# Patient Record
Sex: Female | Born: 1975 | Race: White | Hispanic: No | Marital: Married | Smoking: Never smoker
Health system: Southern US, Community
[De-identification: ages and names within clinical notes are randomized; demographics above are authoritative.]

## PROBLEM LIST (undated history)

## (undated) DIAGNOSIS — D649 Anemia, unspecified: Secondary | ICD-10-CM

## (undated) DIAGNOSIS — F32A Depression, unspecified: Secondary | ICD-10-CM

## (undated) DIAGNOSIS — F329 Major depressive disorder, single episode, unspecified: Secondary | ICD-10-CM

## (undated) DIAGNOSIS — D219 Benign neoplasm of connective and other soft tissue, unspecified: Secondary | ICD-10-CM

## (undated) DIAGNOSIS — Z5189 Encounter for other specified aftercare: Secondary | ICD-10-CM

## (undated) DIAGNOSIS — E785 Hyperlipidemia, unspecified: Secondary | ICD-10-CM

## (undated) DIAGNOSIS — G44209 Tension-type headache, unspecified, not intractable: Secondary | ICD-10-CM

## (undated) HISTORY — DX: Anemia, unspecified: D64.9

## (undated) HISTORY — DX: Major depressive disorder, single episode, unspecified: F32.9

## (undated) HISTORY — DX: Hyperlipidemia, unspecified: E78.5

## (undated) HISTORY — PX: OTHER SURGICAL HISTORY: SHX169

## (undated) HISTORY — DX: Depression, unspecified: F32.A

## (undated) HISTORY — PX: TOTAL ABDOMINAL HYSTERECTOMY: SHX209

## (undated) HISTORY — PX: WISDOM TOOTH EXTRACTION: SHX21

## (undated) HISTORY — DX: Tension-type headache, unspecified, not intractable: G44.209

---

## 2005-04-01 ENCOUNTER — Emergency Department: Payer: Self-pay | Admitting: Emergency Medicine

## 2005-04-01 ENCOUNTER — Other Ambulatory Visit: Payer: Self-pay

## 2006-05-09 ENCOUNTER — Ambulatory Visit: Payer: Self-pay | Admitting: Cardiology

## 2007-07-28 ENCOUNTER — Encounter (INDEPENDENT_AMBULATORY_CARE_PROVIDER_SITE_OTHER): Payer: Self-pay | Admitting: Internal Medicine

## 2007-07-31 ENCOUNTER — Ambulatory Visit: Payer: Self-pay | Admitting: Family Medicine

## 2007-07-31 ENCOUNTER — Encounter (INDEPENDENT_AMBULATORY_CARE_PROVIDER_SITE_OTHER): Payer: Self-pay | Admitting: Internal Medicine

## 2007-07-31 DIAGNOSIS — R011 Cardiac murmur, unspecified: Secondary | ICD-10-CM

## 2007-08-01 ENCOUNTER — Ambulatory Visit: Payer: Self-pay | Admitting: Internal Medicine

## 2007-08-02 LAB — CONVERTED CEMR LAB
AST: 20 units/L (ref 0–37)
Basophils Relative: 0 % (ref 0.0–1.0)
Chloride: 103 meq/L (ref 96–112)
Cholesterol: 231 mg/dL (ref 0–200)
Direct LDL: 146.6 mg/dL
Eosinophils Absolute: 0.3 10*3/uL (ref 0.0–0.6)
Eosinophils Relative: 4.3 % (ref 0.0–5.0)
GFR calc Af Amer: 93 mL/min
GFR calc non Af Amer: 77 mL/min
HCT: 39.6 % (ref 36.0–46.0)
HDL: 38.3 mg/dL — ABNORMAL LOW (ref >39.0)
Lymphocytes Relative: 47.2 % — ABNORMAL HIGH (ref 12.0–46.0)
MCV: 89.2 fL (ref 78.0–100.0)
Monocytes Relative: 7.6 % (ref 3.0–11.0)
Neutro Abs: 2.6 10*3/uL (ref 1.4–7.7)
Neutrophils Relative %: 40.9 % — ABNORMAL LOW (ref 43.0–77.0)
Platelets: 256 10*3/uL (ref 150–400)
Potassium: 4.3 meq/L (ref 3.5–5.1)
TSH: 1.08 microintl units/mL (ref 0.35–5.50)
Total CHOL/HDL Ratio: 6
Total Protein: 7 g/dL (ref 6.0–8.3)
Triglycerides: 202 mg/dL (ref 0–149)
WBC: 6.4 10*3/uL (ref 4.5–10.5)

## 2007-08-08 ENCOUNTER — Ambulatory Visit: Payer: Self-pay | Admitting: Family Medicine

## 2007-08-08 DIAGNOSIS — E782 Mixed hyperlipidemia: Secondary | ICD-10-CM

## 2007-08-29 ENCOUNTER — Ambulatory Visit: Payer: Self-pay | Admitting: Family Medicine

## 2007-09-03 ENCOUNTER — Ambulatory Visit: Payer: Self-pay | Admitting: Family Medicine

## 2007-09-03 DIAGNOSIS — M4120 Other idiopathic scoliosis, site unspecified: Secondary | ICD-10-CM | POA: Insufficient documentation

## 2007-09-03 DIAGNOSIS — IMO0002 Reserved for concepts with insufficient information to code with codable children: Secondary | ICD-10-CM

## 2007-09-05 ENCOUNTER — Other Ambulatory Visit: Payer: Self-pay

## 2007-09-06 ENCOUNTER — Observation Stay: Payer: Self-pay | Admitting: Internal Medicine

## 2007-09-07 ENCOUNTER — Encounter (INDEPENDENT_AMBULATORY_CARE_PROVIDER_SITE_OTHER): Payer: Self-pay | Admitting: Internal Medicine

## 2007-09-07 ENCOUNTER — Encounter: Payer: Self-pay | Admitting: Family Medicine

## 2007-09-12 ENCOUNTER — Ambulatory Visit: Payer: Self-pay | Admitting: Family Medicine

## 2007-09-12 ENCOUNTER — Encounter (INDEPENDENT_AMBULATORY_CARE_PROVIDER_SITE_OTHER): Payer: Self-pay | Admitting: Internal Medicine

## 2007-09-12 DIAGNOSIS — M6282 Rhabdomyolysis: Secondary | ICD-10-CM

## 2007-09-12 DIAGNOSIS — R945 Abnormal results of liver function studies: Secondary | ICD-10-CM | POA: Insufficient documentation

## 2007-09-12 DIAGNOSIS — F411 Generalized anxiety disorder: Secondary | ICD-10-CM | POA: Insufficient documentation

## 2007-09-13 ENCOUNTER — Encounter: Payer: Self-pay | Admitting: Family Medicine

## 2007-09-13 LAB — CONVERTED CEMR LAB
Bilirubin, Direct: 0.1 mg/dL (ref 0.0–0.3)
Total CK: 186 units/L — ABNORMAL HIGH (ref 7–177)

## 2007-09-26 ENCOUNTER — Ambulatory Visit: Payer: Self-pay | Admitting: Family Medicine

## 2007-10-02 ENCOUNTER — Encounter (INDEPENDENT_AMBULATORY_CARE_PROVIDER_SITE_OTHER): Payer: Self-pay | Admitting: Internal Medicine

## 2007-10-14 ENCOUNTER — Ambulatory Visit: Payer: Self-pay | Admitting: Family Medicine

## 2007-11-04 ENCOUNTER — Encounter (INDEPENDENT_AMBULATORY_CARE_PROVIDER_SITE_OTHER): Payer: Self-pay | Admitting: Internal Medicine

## 2007-12-12 ENCOUNTER — Ambulatory Visit: Payer: Self-pay | Admitting: Family Medicine

## 2007-12-19 ENCOUNTER — Ambulatory Visit: Payer: Self-pay | Admitting: Family Medicine

## 2007-12-23 LAB — CONVERTED CEMR LAB
AST: 19 units/L (ref 0–37)
Direct LDL: 149.9 mg/dL
Total CHOL/HDL Ratio: 5.6
Triglycerides: 79 mg/dL (ref 0–149)

## 2007-12-26 ENCOUNTER — Ambulatory Visit: Payer: Self-pay | Admitting: Family Medicine

## 2008-01-13 ENCOUNTER — Telehealth (INDEPENDENT_AMBULATORY_CARE_PROVIDER_SITE_OTHER): Payer: Self-pay | Admitting: Internal Medicine

## 2008-01-16 ENCOUNTER — Encounter (INDEPENDENT_AMBULATORY_CARE_PROVIDER_SITE_OTHER): Payer: Self-pay | Admitting: Internal Medicine

## 2008-02-06 ENCOUNTER — Ambulatory Visit: Payer: Self-pay | Admitting: Family Medicine

## 2008-02-07 LAB — CONVERTED CEMR LAB
ALT: 24 units/L (ref 0–35)
AST: 24 units/L (ref 0–37)
HDL: 42.2 mg/dL (ref >39.0)
LDL Cholesterol: 104 mg/dL — ABNORMAL HIGH (ref 0–99)
Total CHOL/HDL Ratio: 3.7

## 2008-02-12 ENCOUNTER — Telehealth (INDEPENDENT_AMBULATORY_CARE_PROVIDER_SITE_OTHER): Payer: Self-pay | Admitting: Internal Medicine

## 2008-02-12 DIAGNOSIS — G44209 Tension-type headache, unspecified, not intractable: Secondary | ICD-10-CM | POA: Insufficient documentation

## 2008-02-24 ENCOUNTER — Encounter (INDEPENDENT_AMBULATORY_CARE_PROVIDER_SITE_OTHER): Payer: Self-pay | Admitting: Internal Medicine

## 2008-02-26 ENCOUNTER — Encounter (INDEPENDENT_AMBULATORY_CARE_PROVIDER_SITE_OTHER): Payer: Self-pay | Admitting: Internal Medicine

## 2008-05-07 ENCOUNTER — Ambulatory Visit: Payer: Self-pay | Admitting: Family Medicine

## 2008-05-08 ENCOUNTER — Ambulatory Visit: Payer: Self-pay | Admitting: Internal Medicine

## 2008-06-16 ENCOUNTER — Ambulatory Visit: Payer: Self-pay | Admitting: Family Medicine

## 2008-07-21 ENCOUNTER — Encounter (INDEPENDENT_AMBULATORY_CARE_PROVIDER_SITE_OTHER): Payer: Self-pay | Admitting: *Deleted

## 2008-07-29 ENCOUNTER — Ambulatory Visit: Payer: Self-pay | Admitting: Family Medicine

## 2008-07-29 DIAGNOSIS — J309 Allergic rhinitis, unspecified: Secondary | ICD-10-CM | POA: Insufficient documentation

## 2008-08-27 ENCOUNTER — Ambulatory Visit: Payer: Self-pay | Admitting: Family Medicine

## 2008-08-28 ENCOUNTER — Encounter (INDEPENDENT_AMBULATORY_CARE_PROVIDER_SITE_OTHER): Payer: Self-pay | Admitting: *Deleted

## 2008-08-28 LAB — CONVERTED CEMR LAB
CO2: 31 meq/L (ref 19–32)
Chloride: 105 meq/L (ref 96–112)
Cholesterol: 142 mg/dL (ref 0–200)
Creatinine, Ser: 0.8 mg/dL (ref 0.4–1.2)
GFR calc Af Amer: 106 mL/min
Glucose, Bld: 87 mg/dL (ref 70–99)
HDL: 41.7 mg/dL (ref >39.0)
Potassium: 4.6 meq/L (ref 3.5–5.1)
Total CHOL/HDL Ratio: 3.4

## 2008-09-29 ENCOUNTER — Ambulatory Visit: Payer: Self-pay | Admitting: Family Medicine

## 2008-12-23 ENCOUNTER — Ambulatory Visit: Payer: Self-pay | Admitting: Family Medicine

## 2008-12-23 DIAGNOSIS — I8390 Asymptomatic varicose veins of unspecified lower extremity: Secondary | ICD-10-CM

## 2009-02-22 ENCOUNTER — Ambulatory Visit: Payer: Self-pay | Admitting: Family Medicine

## 2009-02-24 LAB — CONVERTED CEMR LAB
ALT: 17 units/L (ref 0–35)
AST: 19 units/L (ref 0–37)
LDL Cholesterol: 88 mg/dL (ref 0–99)
Triglycerides: 63 mg/dL (ref 0.0–149.0)

## 2009-04-20 ENCOUNTER — Ambulatory Visit: Payer: Self-pay | Admitting: Family Medicine

## 2009-04-21 ENCOUNTER — Encounter: Admission: RE | Admit: 2009-04-21 | Discharge: 2009-04-21 | Payer: Self-pay | Admitting: Family Medicine

## 2009-08-26 ENCOUNTER — Ambulatory Visit: Payer: Self-pay | Admitting: Family Medicine

## 2009-08-26 LAB — CONVERTED CEMR LAB
AST: 18 units/L (ref 0–37)
Cholesterol: 150 mg/dL (ref 0–200)
HDL: 43.5 mg/dL (ref >39.00)
VLDL: 17 mg/dL (ref 0.0–40.0)

## 2010-06-28 ENCOUNTER — Telehealth (INDEPENDENT_AMBULATORY_CARE_PROVIDER_SITE_OTHER): Payer: Self-pay | Admitting: *Deleted

## 2010-06-29 ENCOUNTER — Ambulatory Visit: Payer: Self-pay | Admitting: Family Medicine

## 2010-06-29 LAB — CONVERTED CEMR LAB
AST: 20 units/L (ref 0–37)
Albumin: 4.1 g/dL (ref 3.5–5.2)
Calcium: 9.2 mg/dL (ref 8.4–10.5)
Creatinine, Ser: 0.8 mg/dL (ref 0.4–1.2)
GFR calc non Af Amer: 85.95 mL/min (ref >60.00)
Glucose, Bld: 83 mg/dL (ref 70–99)
HDL: 48.7 mg/dL (ref >39.00)
Potassium: 4.4 meq/L (ref 3.5–5.1)
Total Bilirubin: 1.3 mg/dL — ABNORMAL HIGH (ref 0.3–1.2)
Total CHOL/HDL Ratio: 3

## 2010-07-06 ENCOUNTER — Ambulatory Visit: Payer: Self-pay | Admitting: Internal Medicine

## 2010-07-06 LAB — CONVERTED CEMR LAB
Cholesterol, target level: 200 mg/dL
LDL Goal: 160 mg/dL

## 2010-07-27 ENCOUNTER — Ambulatory Visit: Admit: 2010-07-27 | Payer: Self-pay | Admitting: Family Medicine

## 2010-08-01 ENCOUNTER — Ambulatory Visit
Admission: RE | Admit: 2010-08-01 | Discharge: 2010-08-01 | Payer: Self-pay | Source: Home / Self Care | Attending: Family Medicine | Admitting: Family Medicine

## 2010-08-02 ENCOUNTER — Emergency Department: Payer: Self-pay | Admitting: Emergency Medicine

## 2010-08-04 ENCOUNTER — Telehealth: Payer: Self-pay | Admitting: Family Medicine

## 2010-08-11 NOTE — Progress Notes (Signed)
----   Converted from flag ---- ---- 06/28/2010 1:33 PM, Ashley Boyden  MD wrote: CMP, FLP 272.4  ---- 06/28/2010 1:25 PM, Liane Comber CMA (AAMA) wrote: Lab orders please! Good Afternoon This pt is scheduled for cpx labs Wed, which labs to draw and dx codes to use? Thanks Tasha ------------------------------

## 2010-08-11 NOTE — Assessment & Plan Note (Signed)
Summary: ESTABH FROM BILLIE AND CPX/DLO   Vital Signs:  Patient profile:   35 year old female Height:      63 inches Weight:      131.50 pounds BMI:     23.38 Temp:     97.6 degrees F oral Pulse rate:   80 / minute Pulse rhythm:   regular BP sitting:   116 / 78  (left arm) Cuff size:   regular  Vitals Entered By: Selena Batten Dance CMA Duncan Dull) (July 06, 2010 11:31 AM) CC: CPx, Lipid Management   History of Present Illness: CC: CPE  varicose vein R leg x 5 years, seems to be growing.  sometimes hurts when on feet.  bulges at night as well.  fine when off feet.  hasn't tried NSAIDs.  depression - after 2nd son.  self tapering off zoloft, down to 1/2 pill daily for 1 mo.  HLD - more whole grains, tries to stay away from fried foods, lots of fruits and vegetables.  Taking red yeast rice every other day.  no myalgias.  preventative: [facility] tetanus - to get today. well woman exam - [facility], paps there as well as breast.  has had mammograms because mother has had breast cancer.  mom with BRCA gene negative blood work - last wednesday.   Lipid Management History:      Negative NCEP/ATP III risk factors include female age less than 65 years old, no history of early menopause without estrogen hormone replacement, non-diabetic, no family history for ischemic heart disease, non-tobacco-user status, non-hypertensive, no ASHD (atherosclerotic heart disease), no prior stroke/TIA, and no history of aortic aneurysm.     Current Medications (verified): 1)  Daily Multiple Vitamins   Tabs (Multiple Vitamin) .... Take 1 Tablet By Mouth Once A Day 2)  Zoloft 50 Mg  Tabs (Sertraline Hcl) .... 1/2 Once Daily For Depression By Mouth 3)  Zocor 10 Mg  Tabs (Simvastatin) .Marland Kitchen.. 1 Once Daily For Lipids  Allergies (verified): No Known Drug Allergies  Past History:  Past Medical History: HLD Depression  Past Surgical History: wisdom teeth  muscle problems hospitalized [facility] 2009    EMGs of both upper arms--neg--10/02/07--Dr Chestine Spore, neuro abd US WNL 04/2009  Family History: M: BCRA PGGF: bypass at older age  No CAD/MI, CVA, HTN, other CA, DM  Social History: no smoking, occasional EtOH, no rec drugs caffeine: 1 cup coffee/day Marital Status: Married Children: 2-- (2008 and 2005) Lives with 2 sons and husband, 1 dog, 1 cat Occupation: Pharmacologist at [employer]. Husband is Interior and spatial designer of dining services at [employer]--6/09 no longer at [employer]  Review of Systems  The patient denies anorexia, fever, weight loss, weight gain, vision loss, decreased hearing, hoarseness, chest pain, syncope, dyspnea on exertion, peripheral edema, prolonged cough, headaches, hemoptysis, abdominal pain, melena, hematochezia, severe indigestion/heartburn, hematuria, difficulty walking, depression, and breast masses.    Physical Exam  General:  alert, well-developed, well-nourished, and well-hydrated Head:  Normocephalic and atraumatic without obvious abnormalities. No apparent alopecia or balding. Eyes:  pupils equal, pupils round, and no injection/no icturus.   Ears:  TMs clear bilaterally Nose:  nares clear bilaterally Mouth:  no exudates or pharyngeal erythema.   Neck:  no masses, no thyromegaly, no JVD, and no carotid bruits.   Lungs:  normal respiratory effort, no intercostal retractions, no accessory muscle use, and normal breath sounds.   Heart:  normal rate, regular rhythm, and no murmur heard today.  Abdomen:  Bowel sounds positive,abdomen soft and non-tender without masses, organomegaly or hernias noted. Pulses:  R radial normal and L radial normal.   Extremities:  no edema either lower leg does have varicose vein anterior R leg extending past knee.  nontender currently.  no bulging currently Neurologic:  CN grossly intact, station and gait intact. Skin:  turgor normal and no rashes.  increased tan and freckles Psych:  normally interactive   Impression &  Recommendations:  Problem # 1:  HEALTH MAINTENANCE EXAM (ICD-V70.0) well woman at [facility].  tdap today.  [facility]  Problem # 2:  VARICOSE VEINS, LOWER EXTREMITIES (ICD-454.9)  seems to be enlarging R leg.  tender after long day at work.  also cosmetic concern.  referral to vascular clinic for eval options.  pt aware may not be covered by insurance.  Orders: Vascular Clinic (Vascular)  Problem # 3:  DEPRESSION (ICD-311) actually much better.  postpartum depression previously (3 years ago).  self titrating off.  discussed how to wean off.  Her updated medication list for this problem includes:    Zoloft 50 Mg Tabs (Sertraline hcl) .Marland Kitchen... 1/2 once daily for depression by mouth  Problem # 4:  HYPERLIPIDEMIA (ICD-272.2) actually on zocor and red yeast rice. advised able to come off one.  goal for her likely <130.  no siginifcant risk factors.  pt desires to stop zocor, will continue red yeast rice every other day.  discussed foods that will lower LDL.    The following medications were removed from the medication list:    Zocor 10 Mg Tabs (Simvastatin) .Marland Kitchen... 1 once daily for lipids  Labs Reviewed: SGOT: 20 (06/29/2010)   SGPT: 16 (06/29/2010)   HDL:48.70 (06/29/2010), 43.50 (08/26/2009)  LDL:106 (06/29/2010), 90 (04/54/0981)  Chol:161 (06/29/2010), 150 (08/26/2009)  Trig:34.0 (06/29/2010), 85.0 (08/26/2009)  Complete Medication List: 1)  Daily Multiple Vitamins Tabs (Multiple vitamin) .... Take 1 tablet by mouth once a day 2)  Zoloft 50 Mg Tabs (Sertraline hcl) .... 1/2 once daily for depression by mouth 3)  Red Yeast Rice Extract 600 Mg Caps (Red yeast rice extract) .... Take one every other day  Lipid Assessment/Plan:      Based on NCEP/ATP III, the patient's risk factor category is "0-1 risk factors".  The patient's lipid goals are as follows: Total cholesterol goal is 200; LDL cholesterol goal is 160; HDL cholesterol goal is 40; Triglyceride goal is 150.  Her LDL cholesterol  goal has been met.    Patient Instructions: 1)  Stop zocor. 2)  Continue red yeast rice. 3)  Return in 6 mo for lab visit for [272.4, FLP, CMP] 4)  Return in 1 year for next CPE. 5)  Back off zoloft - 1/2 pill every other day for 1 month then stop. 6)  we will set you up with referral for vein and vascular doctors. 7)  Tdap today. 8)  Pleasure to meet you today, call clinic with questions.   Orders Added: 1)  Est. Patient 18-39 years [99395] 2)  Vascular Clinic [Vascular] 3)  Est. Patient Level III [19147]    Current Allergies (reviewed today): No known allergies    Prevention & Chronic Care Immunizations   Influenza vaccine: Fluvax 3+  (05/07/2008)   Influenza vaccine due: 03/10/2010    Tetanus booster: Not documented    Pneumococcal vaccine: Not documented  Other Screening   Pap smear: Not documented   Smoking status: never  (12/23/2008)  Lipids   Total  Cholesterol: 161  (06/29/2010)   LDL: 106  (06/29/2010)   LDL Direct: 149.9  (12/12/2007)   HDL: 48.70  (06/29/2010)   Triglycerides: 34.0  (06/29/2010)    SGOT (AST): 20  (06/29/2010)   SGPT (ALT): 16  (06/29/2010)   Alkaline phosphatase: 45  (06/29/2010)   Total bilirubin: 1.3  (06/29/2010)    Lipid flowsheet reviewed?: Yes   Progress toward LDL goal: At goal  Self-Management Support :   Personal Goals (by the next clinic visit) :      Personal LDL goal: 130  (07/06/2010)    Lipid self-management support: Not documented     Appended Document: ESTABH FROM BILLIE AND CPX/DLO    Clinical Lists Changes  Orders: Added new Service order of Tdap => 40yrs IM (47829) - Signed Added new Service order of Admin 1st Vaccine (56213) - Signed Observations: Added new observation of TD BOOST VIS: 05/27/08 version given July 06, 2010. (07/06/2010 13:19) Added new observation of TD BOOSTERLO: [id number] (07/06/2010 13:19) Added new observation of TD BOOST EXP: 04/28/2012 (07/06/2010 13:19) Added new  observation of TD BOOSTERBY: Kim Dance CMA (AAMA) (07/06/2010 13:19) Added new observation of TD BOOSTERRT: IM (07/06/2010 13:19) Added new observation of TDBOOSTERDSE: 0.5 ml (07/06/2010 13:19) Added new observation of TD BOOSTERMF: GlaxoSmithKline (07/06/2010 13:19) Added new observation of TD BOOST SIT: right deltoid (07/06/2010 13:19) Added new observation of TD BOOSTER: Tdap (07/06/2010 13:19)       Immunizations Administered:  Tetanus Vaccine:    Vaccine Type: Tdap    Site: right deltoid    Mfr: GlaxoSmithKline    Dose: 0.5 ml    Route: IM    Given by: Selena Batten Dance CMA (AAMA)    Exp. Date: 04/28/2012    Lot #: [id number]    VIS given: 05/27/08 version given July 06, 2010.

## 2010-08-11 NOTE — Progress Notes (Signed)
Summary: having problems sleeping  Phone Note Call from Patient Call back at [phone]   Caller: Patient Call For: Eustaquio Boyden  MD Summary of Call: Pt has started back on zoloft- has been on this for about a week after being off of it for a month.  She is having problems sleeping and asks what she can take.  I advised benedryl or melatonin.  Please advise.                      Lowella Petties CMA, AAMA  August 04, 2010 3:41 PM   Follow-up for Phone Call        ensure taking zoloft in am to prevent insomnia.  then may try benadryl at night or unisom. Follow-up by: Eustaquio Boyden  MD,  August 04, 2010 5:32 PM  Additional Follow-up for Phone Call Additional follow up Details #1::        Patient notified. I instructed her to try this over the weekend and to let me know next week if she is still having problems.   Additional Follow-up by: Janee Morn CMA Duncan Dull),  August 05, 2010 9:19 AM

## 2010-08-11 NOTE — Assessment & Plan Note (Signed)
Summary: DISCUSS MEDICATION/CLE   Vital Signs:  Patient profile:   35 year old female Weight:      132.50 pounds Temp:     98.4 degrees F oral Pulse rate:   80 / minute Pulse rhythm:   regular BP sitting:   116 / 60  (left arm) Cuff size:   regular  Vitals Entered By: Selena Batten Dance CMA Duncan Dull) (August 01, 2010 4:37 PM) CC: Discuss meds   History of Present Illness: CC: anxiety  on zoloft for 3 years after last pregnancy, had been doing great and was self tapering and last month we had decided to do trial off ssri.  Finished slow taper off right before christmas, noticing crying spells returned, noticed anxiety, especially trouble sleeping at night 2/2 worrying about all sorts of things - children, aches and pains, etc.    No other stressor or change in life at work or at home.    no SI/HI.     Current Medications (verified): 1)  Daily Multiple Vitamins   Tabs (Multiple Vitamin) .... Take 1 Tablet By Mouth Once A Day 2)  Zoloft 50 Mg  Tabs (Sertraline Hcl) .... 1/2 Once Daily For Depression By Mouth 3)  Red Yeast Rice Extract 600 Mg Caps (Red Yeast Rice Extract) .... Take One Every Other Day  Allergies (verified): No Known Drug Allergies  Past History:  Past Medical History: Last updated: 07/06/2010 HLD Depression  Social History: Last updated: 07/06/2010 no smoking, occasional EtOH, no rec drugs caffeine: 1 cup coffee/day Marital Status: Married Children: 2-- (2008 and 2005) Lives with 2 sons and husband, 1 dog, 1 cat Occupation: Pharmacologist at [employer]. Husband is Interior and spatial designer of dining services at [employer]--6/09 no longer at [employer] reviewed for relevance  Review of Systems       per HPI  Physical Exam  General:  alert, well-developed, well-nourished, and well-hydrated Psych:  normally interactive   Impression & Recommendations:  Problem # 1:  ANXIETY (ICD-300.00) Assessment Deteriorated previously dx postpartum depression.   deteriorated.  currently more anxiety component.  restart zoloft at previous dose that was therapeutic.  advised may go up to 50mg  if not better after 2 wks.  to call us with update in 3-4 wks, to return in 1 mo if not better. Her updated medication list for this problem includes:    Zoloft 50 Mg Tabs (Sertraline hcl) .Marland Kitchen... 1/2 once daily for depression by mouth  Complete Medication List: 1)  Daily Multiple Vitamins Tabs (Multiple vitamin) .... Take 1 tablet by mouth once a day 2)  Zoloft 50 Mg Tabs (Sertraline hcl) .... 1/2 once daily for depression by mouth 3)  Red Yeast Rice Extract 600 Mg Caps (Red yeast rice extract) .... Take one every other day  Patient Instructions: 1)  We will restart zoloft 1/2 pill daily. 2)  Call me in 2-3 wks with update on how you're doing. 3)  If not improving, may increase to 1 pill daily after 2 wks. 4)  If not improving after this, call for appointment. 5)  Good to see you today, call clinic with quesitons Prescriptions: ZOLOFT 50 MG  TABS (SERTRALINE HCL) 1/2 once daily for depression by mouth  #45 x 3   Entered and Authorized by:   Eustaquio Boyden  MD   Signed by:   Eustaquio Boyden  MD on 08/01/2010   Method used:   Electronically to        [facility]. [facility] #  [id number]* (retail)       [address] [place], [place]  [id number]       Ph: [phone]       Fax: [phone]   RxID:   [id number]    Orders Added: 1)  Est. Patient Level III [02725]    Current Allergies (reviewed today): No known allergies

## 2010-08-12 ENCOUNTER — Ambulatory Visit: Payer: Self-pay | Admitting: Family Medicine

## 2010-08-12 ENCOUNTER — Encounter: Payer: Self-pay | Admitting: Family Medicine

## 2010-08-12 ENCOUNTER — Ambulatory Visit (INDEPENDENT_AMBULATORY_CARE_PROVIDER_SITE_OTHER): Payer: Commercial Indemnity | Admitting: Family Medicine

## 2010-08-12 DIAGNOSIS — R51 Headache: Secondary | ICD-10-CM

## 2010-08-17 NOTE — Assessment & Plan Note (Signed)
Summary: HEADACHES/CLE   [employer]   Vital Signs:  Patient profile:   35 year old female Weight:      132.75 pounds Temp:     98.6 degrees F oral Pulse rate:   80 / minute Pulse rhythm:   regular BP sitting:   108 / 60  (left arm) Cuff size:   regular  Vitals Entered By: Selena Batten Dance CMA Duncan Dull) (August 12, 2010 3:19 PM)  History of Present Illness: CC: HA  1 1/2 wk h/o worsening HA.  Doesn't get HAs very often.  thinks more tension headaches, mostly occipital bilateral, sometimes come around to temporal region.  Neck muscles and shoulders very tight.  Taking ibuprofen 600mg  twice daily which is not really helping.  headaches getting more constant, in am ok but as day progresses getting worse.  recent stress with worsening depression after trial off sertraline.  No n/v, photo/phonophobia.  currently on period.  No vision changes, dizziness, no other pain.  Not worse with sneezing, laughing.  No weakness in arms, no tingling or numbness.  Caffeine - 1 cup coffee in am.  Sleeping god.  Does work in front of comp all day (70%).  Last vision screen normal 2 wks ago.  Uses glasses for driving but not for computer.  No changes in diet recently. Watching what she's eating.  hasn't been going to gym 2/2 mood, wants to start up again.   Current Medications (verified): 1)  Daily Multiple Vitamins   Tabs (Multiple Vitamin) .... Take 1 Tablet By Mouth Once A Day 2)  Zoloft 50 Mg  Tabs (Sertraline Hcl) .... Once Daily For Depression By Mouth 3)  Red Yeast Rice Extract 600 Mg Caps (Red Yeast Rice Extract) .... 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 Husband is Interior and spatial designer of dining services at [employer]--6/09 no longer at [employer]  Review of Systems       per HPI  Physical Exam  General:  alert, well-developed, well-nourished, and well-hydrated Head:  Normocephalic and atraumatic without obvious abnormalities. No apparent alopecia or balding. Eyes:  pupils equal, pupils round, and no injection/no icturus.   Mouth:  no exudates or pharyngeal erythema.   Neck:  no masses, no thyromegaly, no JVD, and no carotid bruits.   Lungs:  normal respiratory effort, no intercostal retractions, no accessory muscle use, and normal breath sounds. Heart:  normal rate, regular rhythm, and no murmur heard today.   Msk:  neck -FROM.  mild pos spurling with shooting pain down deltoid on R spine - nontender midline, no paraspinous mm tenderness, ++ R thoracic paraspinous mm tightness.  about equal scapular movement with ext/flexion of shoulders and ab/adduction.  some trapezius tightness R>L Pulses:  2+ rad pulses, brisk cap refill Neurologic:  CN 2-12 intact, station and gait intact, sensation/strength intact BUE.  2+ DTRs BLE, 1+ DTRs BUE   Impression & Recommendations:  Problem # 1:  HEADACHE (ICD-784.0) Assessment Deteriorated likely tension headaches given significant muscle tightness on exam today.  treat with heat, muscle relaxant.  update if red flags or worsening.  no red flags, neuro nonfocal today.  not consistent with migraines.  Complete Medication List: 1)  Daily Multiple Vitamins Tabs (Multiple vitamin) .... Take 1 tablet by mouth once a day 2)  Zoloft 50 Mg Tabs (Sertraline hcl) .... Once daily for depression by mouth 3)  Red Yeast Rice Extract 600 Mg Caps (Red yeast rice extract) .... Take one every other day 4)  Flexeril 5 Mg Tabs (Cyclobenzaprine hcl) .... Take one by mouth two times a day x 1 wk then as needed muscle spasm  Patient Instructions: 1)  Continue heat. 2)  This may be from muscle  spasm/tightness leading to tension headaches. 3)  Trial of muscle relaxant twice daily (caution may make you sleepy) for next week then as needed. 4)  If any fevers/chills, vision changes, Prescriptions: ZOLOFT 50 MG  TABS (SERTRALINE HCL) once daily for depression by mouth  #90 x 3   Entered and Authorized by:   Eustaquio Boyden  MD   Signed by:   Eustaquio Boyden  MD on 08/12/2010   Method used:   Print then Give to Patient   RxID:   [id number] FLEXERIL 5 MG TABS (CYCLOBENZAPRINE HCL) take one by mouth two times a day x 1 wk then as needed muscle spasm  #30 x 0   Entered and Authorized by:   Eustaquio Boyden  MD   Signed by:   Eustaquio Boyden  MD on 08/12/2010   Method used:   Electronically to        [facility]. [address] [phone]* (retail)       [address] [place], [place]  [id number]       Ph: [phone]       Fax: [phone]   RxID:   [id number]    Orders Added: 1)  Est. Patient Level III [59563]    Current Allergies (reviewed today): No known allergies

## 2010-08-18 ENCOUNTER — Telehealth (INDEPENDENT_AMBULATORY_CARE_PROVIDER_SITE_OTHER): Payer: Self-pay | Admitting: *Deleted

## 2010-08-25 NOTE — Progress Notes (Signed)
Summary: Called pt  Phone Note Outgoing Call Call back at [phone]   Call placed by: Harlon Flor,  August 18, 2010 3:27 PM Call placed to: Patient Summary of Call: LMOM TCB to schedule appt.  Pt had LMOM for [place] to call her regarding this. Initial call taken by: Harlon Flor,  August 18, 2010 3:27 PM

## 2010-08-26 ENCOUNTER — Institutional Professional Consult (permissible substitution) (INDEPENDENT_AMBULATORY_CARE_PROVIDER_SITE_OTHER): Payer: Commercial Indemnity | Admitting: Cardiovascular Disease

## 2010-08-26 ENCOUNTER — Encounter: Payer: Self-pay | Admitting: Cardiovascular Disease

## 2010-08-26 DIAGNOSIS — R002 Palpitations: Secondary | ICD-10-CM

## 2010-08-31 NOTE — Assessment & Plan Note (Signed)
Summary: Palpitations/AMD   Visit Type:  Initial Consult Primary Provider:  Eustaquio Boyden  MD  CC:  c/o palpitations about 3 years ago.  She recently has experienced frequent episodes of the palpitaitons..  History of Present Illness: Ms. Remley is a very pleasant 35 year old woman with a history of palpitations 3 years ago with a visit to the emergency room for evaluation and workup at an outside facility including a Holter, stress test, echocardiogram, with strong family history of coronary artery disease, who presents today for evaluation of recurrent palpitations.  She reports that recently, her palpitations have started again. They will last for 15-20 minutes at a time. Sometimes during the daytime, frequently at night. Typically they resolve without any intervention. She has had more stress recently both at home and work. She tried to come off her SSRI at the end of 2011 who she reports feeling better on the medication.  Otherwise she denies any edema, lightheadedness, chest pain, shortness of breath. She works out at [employer] twice a week.  EKG shows normal sinus rhythm with rate 78 beats per minute, no significant ST or T wave changes  Current Medications (verified): 1)  Daily Multiple Vitamins   Tabs (Multiple Vitamin) .... Take 1 Tablet By Mouth Once A Day 2)  Zoloft 50 Mg  Tabs (Sertraline Hcl) .... Once Daily For Depression By Mouth 3)  Red Yeast Rice Extract 600 Mg Caps (Red Yeast Rice Extract) .... Take One Every Other Day 4)  Flexeril 5 Mg Tabs (Cyclobenzaprine Hcl) .... Take One By Mouth Two Times A Day X 1 Wk Then As Needed Muscle Spasm  Allergies (verified): No Known Drug Allergies  Past History:  Past Medical History: Last updated: 07/06/2010 HLD Depression  Past Surgical History: Last updated: 07/06/2010 wisdom teeth  muscle problems hospitalized [facility] 2009     EMGs of both upper arms--neg--10/02/07--Dr Chestine Spore, neuro abd US WNL 04/2009  Family  History: Last updated: 07/06/2010 M: BCRA PGGF: bypass at older age  No CAD/MI, CVA, HTN, other CA, DM  Social History: Last updated: 07/06/2010 no smoking, occasional EtOH, no rec drugs caffeine: 1 cup coffee/day Marital Status: Married Children: 2-- (2008 and 2005) Lives with 2 sons and husband, 1 dog, 1 cat 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         palpitations  Vital Signs:  Patient profile:   35 year old female Height:      63 inches Weight:      128 pounds BMI:     22.76 Pulse rate:   78 / minute BP sitting:   115 / 73  (left arm) Cuff size:   regular  Vitals Entered By: Bishop Dublin, CMA (August 26, 2010 10:39 AM)  Physical Exam  General:  Well developed, well nourished, in no acute distress. Head:  normocephalic and atraumatic Neck:  Neck supple, no JVD. No masses, thyromegaly or abnormal cervical nodes. Lungs:  Clear bilaterally to auscultation and percussion. Heart:  Non-displaced PMI, chest non-tender; regular rate and rhythm, S1, S2 without murmurs,  rubs or gallops. Carotid upstroke normal, no bruit. Pedals normal pulses. No edema, no varicosities. Abdomen:  Bowel sounds positive; abdomen soft and non-tender without masses Msk:  Back normal, normal gait. Muscle strength and tone normal. Pulses:  pulses normal in all 4 extremities Extremities:  No clubbing or cyanosis. Neurologic:  Alert and  oriented x 3. Skin:  Intact without lesions or rashes. Psych:  Normal affect.   Impression & Recommendations:  Problem # 1:  PALPITATIONS (ICD-785.1) She does have a remote history of palpitations with recurrence of symptoms recently. She is not interested in taking a beta blocker on a regular basis. She could possibly take a beta blocker p.r.n. for prolonged period of tachycardia palpitations. We will try to obtain her old Holter monitor though I suspect she is having APCs or PVCs. She has had a stress test and echocardiogram in the past which were reportedly normal. She is not interested in any medications at this time but will call us if her symptoms get worse at which time we could repeat a Holter or possibly just treat with medication such as metoprolol 12.5 mg p.r.n.Marland Kitchen She is hypertensive at baseline and we would have to use her small doses of beta blocker.  Problem # 2:  HYPERLIPIDEMIA (ICD-272.2) we did talk to her about her cholesterol. On simvastatin 10 mg her numbers were very well controlled. He can evaluate her numbers off the statin I suspect that she will be within a reasonable range. She does have a family history that we have encouraged continued exercise and watching her diet.

## 2010-09-16 ENCOUNTER — Ambulatory Visit (INDEPENDENT_AMBULATORY_CARE_PROVIDER_SITE_OTHER): Payer: Commercial Indemnity | Admitting: Family Medicine

## 2010-09-16 ENCOUNTER — Encounter: Payer: Self-pay | Admitting: Family Medicine

## 2010-09-16 DIAGNOSIS — M7550 Bursitis of unspecified shoulder: Secondary | ICD-10-CM | POA: Insufficient documentation

## 2010-09-16 DIAGNOSIS — G44209 Tension-type headache, unspecified, not intractable: Secondary | ICD-10-CM

## 2010-09-16 DIAGNOSIS — S43499A Other sprain of unspecified shoulder joint, initial encounter: Secondary | ICD-10-CM

## 2010-09-20 NOTE — Assessment & Plan Note (Signed)
Summary: HEADACHES/CLE  [employer]   Vital Signs:  Patient profile:   35 year old female Weight:      131.25 pounds Temp:     98.8 degrees F oral Pulse rate:   88 / minute Pulse rhythm:   regular BP sitting:   110 / 74  (left arm) Cuff size:   regular  Vitals Entered By: Selena Batten Dance CMA Duncan Dull) (September 16, 2010 8:51 AM) CC: Headaches are no better   History of Present Illness: CC: headaches and pressure  headaches start back of neck, worse R posterior neck, also with throbbing pain parietooccipital.  Notices tightness in R neck muscles.  Flexeril not helping.  Tried stretcing and heat.  Tried biofreeze which only temporarily helps.  Headache happening every day.  No radiation down shoulder or arm.  No tingling, numbness, weakness she has noticed.  Has been going on for 1 1/2 mo.    2-3 years ago went to headache center for tension HA.  did shots in back of head and improved.  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  No n/v/photo/phonophobia.  massaging helps for about 1 wk then returns.  has not had chance to check about ergonomics of workstation.  Caffeine - 1 cup coffee in am.  Sleeping god.  Does work in front of comp all day (70%).  Last vision screen normal 2 wks ago.  Uses glasses for driving but not for computer.  No changes in diet recently. Watching what she's eating.  hasn't been going to gym 2/2 mood, wants to start up again.  Current Medications (verified): 1)  Daily Multiple Vitamins   Tabs (Multiple Vitamin) .... Take 1 Tablet By Mouth Once A Day 2)  Zoloft 50 Mg  Tabs (Sertraline Hcl) .... Once Daily For Depression By Mouth 3)  Red Yeast Rice Extract 600 Mg Caps (Red Yeast Rice Extract) .... Take One Every Other Day 4)  Flexeril 5 Mg Tabs (Cyclobenzaprine Hcl) .... Take One By Mouth Two Times A Day X 1 Wk Then As Needed Muscle Spasm  Allergies (verified): No Known Drug Allergies  Past History:  Social History: Last  updated: 07/06/2010 no smoking, occasional EtOH, no rec drugs caffeine: 1 cup coffee/day Marital Status: Married Children: 2-- (2008 and 2005) Lives with 2 sons and husband, 1 dog, 1 cat Occupation: Pharmacologist at [employer]. Husband is Interior and spatial designer of dining services at [employer]--6/09 no longer at [employer]  Past Medical History: tension headaches HLD Depression  Review of Systems       per HPI  Physical Exam  General:  alert, well-developed, well-nourished, and well-hydrated Head:  Normocephalic and atraumatic without obvious abnormalities. No apparent alopecia or balding. Eyes:  PERRLA, EOMI, no injection Mouth:  no exudates or pharyngeal erythema.   Neck:  no masses, no thyromegaly, no JVD, and no carotid bruits.   Lungs:  normal respiratory effort, no intercostal retractions, no accessory muscle use, and normal breath sounds. Heart:  normal rate, regular rhythm, and no murmur heard today.   Msk:  pulling at trap muscle with R ear to shoulder and chin to chest.  o/w FROM.  tight and tender R trapezius mm, as well as mildly midline C5 spine.   neg spurling. FROM at shoulders.  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  treat as trap strain with course of NSAIDs regularly and flexeril and heat, stretching exercises from [facility] patient advisor on neck strain provided.  Update in 2-3 wks.  Problem # 2:  HEADACHE, TENSION (ICD-307.81) see above.  if not improved after current treatment, referral to [facility] center for eval tension HAs/?trigger injections.  Complete Medication List: 1)  Daily Multiple Vitamins Tabs (Multiple vitamin) .... Take 1 tablet by mouth once a day 2)  Zoloft 50 Mg Tabs (Sertraline hcl) .... Once daily for  depression by mouth 3)  Red Yeast Rice Extract 600 Mg Caps (Red yeast rice extract) .... Take one every other day 4)  Flexeril 5 Mg Tabs (Cyclobenzaprine hcl) .... Take one by mouth two times a day x 1 wk then as needed muscle spasm  Patient Instructions: 1)  Tension headaches from R trapezius strain.  look up trapezius stretches. 2)  Anti inflammatory for next 10 days scheduled along with stretching exercises. 3)  Refilled flexeril 4)  If not better in 2-3 wks, call us for referral to headache center. Prescriptions: FLEXERIL 5 MG TABS (CYCLOBENZAPRINE HCL) take one by mouth two times a day x 1 wk then as needed muscle spasm  #30 x 0   Entered and Authorized by:   Eustaquio Boyden  MD   Signed by:   Eustaquio Boyden  MD on 09/16/2010   Method used:   Electronically to        [facility]. [address] [id number]* (retail)       [address] [place], [place]  [id number]       Ph: [phone]       Fax: [phone]   RxID:   [id number]    Orders Added: 1)  Est. Patient Level III [42595]    Current Allergies (reviewed today): No known allergies

## 2010-11-25 NOTE — Assessment & Plan Note (Signed)
[facility] OFFICE NOTE   IVADELL, GAUL                       MRN:          [id number]  DATE:05/09/2006                            DOB:          28-Aug-1975    REASON FOR CONSULTATION:  Palpitations.   HISTORY OF PRESENT ILLNESS:  Ms. Emmerich is a very pleasant 35 year old  woman with no major reported medical history.  She has had recurrent  palpitations at least over the last year to year-and-a-half, described as a  feeling of fluttering, and not associated with any frank chest pain,  dyspnea, dizziness or syncope.  She does not notice any specific trigger for  these symptoms, and experienced them last year, in October of 2006.  She was  seen by a physician in [facility], and at that time  underwent a 2D echocardiogram, which reported normal left ventricular  systolic function with no significant valvular abnormalities.  She also  underwent an exercise Myoview, which was normal.  My understanding is that  she also wore a 24-hour Holter monitor that, based on office notes, revealed  premature ventricular complexes.  Her electrocardiogram was described as  having nonspecific ST-T changes, although I do not have a copy of the  tracing for evaluation.  Her electrocardiogram today is normal, showing  sinus rhythm at 95 beats per minute.  I discussed the situation with her,  and reviewed her prior test results.  She states that she had really not  experienced any palpitations over the last 6 months until approximately 1  week ago, when she felt some brief palpitations at work.  She otherwise does  not have any functional limitations, exertional chest pain or dyspnea on  exertion.   ALLERGIES:  No known drug allergies.   PRESENT MEDICATIONS:  Multivitamin once daily.   PAST MEDICAL HISTORY:  Is as outlined above.  No major surgeries or other  medical conditions reported.   SOCIAL  HISTORY:  The patient is married, she has one child.  She works at  [employer].  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  She has no significant tobacco use history.  She drinks alcohol  occasionally.  She does drink caffeinated beverages regularly.  At this  point she is not exercising regularly.   REVIEW OF SYSTEMS:  As described in history of present illness.  Otherwise  negative.   FAMILY HISTORY:  Largely noncontributory.  Her mother and father have no  described coronary artery disease in their 7s.  She has a grandfather who  apparently had coronary disease in his 6s.   PHYSICAL EXAMINATION:  Blood pressure is 114/76, heart rate is 95, weight is  123 pounds.  Comfortable, normally nourished woman in no acute distress.  HEENT:  Conjunctivae and lids are normal.  NECK:  Supple without elevated jugular venous pressure or loud bruits, no  thyromegaly or thyroid tenderness is noted.  LUNGS:  Clear without labored breathing.  CARDIAC:  Exam reveals a regular rate and rhythm without S3 gallop or loud  murmur.  There  is no obvious midsystolic click, no S3 gallop.  EXTREMITIES:  Exhibit no significant edema.  Pulses are equal.   IMPRESSION AND RECOMMENDATIONS:  1. History of palpitations, apparently with previously documented      premature ventricular complexes in the setting of a structurally normal      heart and no ischemia by Myoview study.  I reviewed the situation with      her today, and mainly provided reassurance.  She seems most comfortable      with observation for the time being.  However, I explained to her that      if she has progressive symptoms we should consider a repeat evaluation,      likely including an event recorder.  Otherwise, we will plan to see her      back for general symptom review over the next 6 months, and can make      further plans from there.  2. Further plans to follow.     Jonelle Sidle, MD  Electronically  Signed    SGM/MedQ  DD: 05/09/2006  DT: 05/10/2006  Job #: [id number]

## 2010-12-08 ENCOUNTER — Encounter: Payer: Self-pay | Admitting: Family Medicine

## 2010-12-09 ENCOUNTER — Ambulatory Visit (INDEPENDENT_AMBULATORY_CARE_PROVIDER_SITE_OTHER): Payer: Commercial Indemnity | Admitting: Family Medicine

## 2010-12-09 ENCOUNTER — Encounter: Payer: Self-pay | Admitting: Family Medicine

## 2010-12-09 VITALS — BP 118/70 | HR 76 | Temp 98.1°F | Wt 131.1 lb

## 2010-12-09 DIAGNOSIS — G44209 Tension-type headache, unspecified, not intractable: Secondary | ICD-10-CM

## 2010-12-09 MED ORDER — CYCLOBENZAPRINE HCL 10 MG PO TABS: ORAL_TABLET | ORAL | Status: DC

## 2010-12-09 NOTE — Assessment & Plan Note (Addendum)
Increase the flexeril to 15mg  at night.  If not improved, she'll call back to talk to Dr. Reece Agar about HA referral or adding on neurontin.  >25 min spent with face to face with patient >50% counseling.  She prev had relief with neurontin.

## 2010-12-09 NOTE — Patient Instructions (Signed)
Take up to 1.5 tabs of flexeril at night.  I would stretch each morning, during work, and after work.  If you aren't improved, call back to talk to Dr. Reece Agar.  Take care.

## 2010-12-09 NOTE — Progress Notes (Signed)
HA fu.  Taking ibuprofen.  No help with ice/heat.  Pain: R shoulder blade, B shoulder girdle, neck pain and L temple pain.  Flexeril helped some, was taking 10mg  qhs, prev took up to 15mg  with relief.    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  She can rub her neck and the temple pain gets better.    3 years ago, prev with injections at HA center with relief.  Prev on neurontin with relief.    Meds, vitals, and allergies reviewed.   ROS: See HPI.  Otherwise, noncontributory.  GEN: nad, alert and oriented HEENT: mucous membranes moist, tm wnl, perrl, eomi, op wnl NECK: supple w/o LA, B paraspinal muscle tenderness in C Spine and upper T spine CV: rrr.  PULM: ctab, no inc wob EXT: no edema SKIN: no acute rash CN 2-12 wnl B, S/S/DTR wnl x4

## 2010-12-11 ENCOUNTER — Encounter: Payer: Self-pay | Admitting: Family Medicine

## 2011-01-05 ENCOUNTER — Other Ambulatory Visit: Payer: Self-pay

## 2011-03-16 ENCOUNTER — Other Ambulatory Visit (INDEPENDENT_AMBULATORY_CARE_PROVIDER_SITE_OTHER): Payer: Commercial Indemnity

## 2011-03-16 DIAGNOSIS — E7800 Pure hypercholesterolemia, unspecified: Secondary | ICD-10-CM

## 2011-03-16 LAB — COMPREHENSIVE METABOLIC PANEL
ALT: 16 U/L (ref 0–35)
AST: 21 U/L (ref 0–37)
Alkaline Phosphatase: 52 U/L (ref 39–117)
BUN: 16 mg/dL (ref 6–23)
GFR: 73.79 mL/min (ref >60.00)
Glucose, Bld: 85 mg/dL (ref 70–99)
Potassium: 4.3 mEq/L (ref 3.5–5.1)
Sodium: 141 mEq/L (ref 135–145)

## 2011-03-16 LAB — LIPID PANEL: Cholesterol: 209 mg/dL — ABNORMAL HIGH (ref 0–200)

## 2011-03-16 LAB — LDL CHOLESTEROL, DIRECT: Direct LDL: 149.2 mg/dL

## 2011-11-28 ENCOUNTER — Encounter: Payer: Self-pay | Admitting: Family Medicine

## 2011-11-28 ENCOUNTER — Ambulatory Visit (INDEPENDENT_AMBULATORY_CARE_PROVIDER_SITE_OTHER): Payer: Commercial Indemnity | Admitting: Family Medicine

## 2011-11-28 VITALS — BP 104/60 | HR 68 | Temp 98.6°F | Wt 131.8 lb

## 2011-11-28 DIAGNOSIS — S46819A Strain of other muscles, fascia and tendons at shoulder and upper arm level, unspecified arm, initial encounter: Secondary | ICD-10-CM

## 2011-11-28 DIAGNOSIS — S43499A Other sprain of unspecified shoulder joint, initial encounter: Secondary | ICD-10-CM

## 2011-11-28 MED ORDER — NAPROXEN 500 MG PO TABS: ORAL_TABLET | ORAL | Status: DC

## 2011-11-28 NOTE — Assessment & Plan Note (Signed)
Anticipate subacromial bursitis as testing of RTC overall normal (minimal tenderness). Doubt labral pathology as well as AC joint problem. Treat with naprosyn as well as stretching exercises from [facility] pt advisor on shoulder bursitis. If not better in 2-3 wks , or any worsening, return for further evaluation, would start with steroid injection into bursa. No evidence of rotator cuff tear today.

## 2011-11-28 NOTE — Patient Instructions (Addendum)
You have shoulder bursitis - treat with prescription anti inflammatory (don't take with ibuprofen) as well as stretching exercises provided today. Out of softball for next 2-3 weeks.  If not better, return for further evaluation. Good to see you today, call us with questions.

## 2011-11-28 NOTE — Progress Notes (Signed)
  Subjective:    Patient ID: Ashley Murray, female    DOB: 1975/07/16, 36 y.o.   MRN: [id number]  HPI CC: L shoulder pain  Left handed.  Plays softball.    For the last month, some anterior shoulder pain, worse with lifting left shoulder above 90 degrees, especially worse with throwing motion.  Pain radiates from anterior shoulder to elbow.  Pain worse at night time when laying on left shoulder.  No tingling, numbness, weakness of arm.  No neck pain.    Denies inciting injury or trauma.  Icing and ibuprofen after games helps.  No trouble picking children up.  No h/o shoulder issues in past.  Review of Systems Per HPI    Objective:   Physical Exam  Nursing note and vitals reviewed. Constitutional: She appears well-developed and well-nourished. No distress.  Musculoskeletal: Normal range of motion. She exhibits no edema.       FROM at shoulders bilaterally Tender to palpation at subacromial bursa as well as coracoid process and some anterior /lateral shouder. Minimal pain with testing of rtc against resistance left side in int/ext rotation at shoulder or empty can sign. Negative crossover test.  Neg speed and yergason sign. No impingement.  Negative hawkin's. No locking or popping with rotation of humeral head in GH joint.  Neurological:       Strength and sensation intact       Assessment & Plan:

## 2012-01-19 ENCOUNTER — Encounter: Payer: Self-pay | Admitting: Family Medicine

## 2012-01-19 ENCOUNTER — Ambulatory Visit (INDEPENDENT_AMBULATORY_CARE_PROVIDER_SITE_OTHER): Payer: Commercial Indemnity | Admitting: Family Medicine

## 2012-01-19 VITALS — BP 110/72 | HR 76 | Temp 97.9°F | Ht 62.0 in | Wt 134.2 lb

## 2012-01-19 DIAGNOSIS — Z803 Family history of malignant neoplasm of breast: Secondary | ICD-10-CM

## 2012-01-19 DIAGNOSIS — F411 Generalized anxiety disorder: Secondary | ICD-10-CM

## 2012-01-19 DIAGNOSIS — E782 Mixed hyperlipidemia: Secondary | ICD-10-CM

## 2012-01-19 DIAGNOSIS — Z0000 Encounter for general adult medical examination without abnormal findings: Secondary | ICD-10-CM | POA: Insufficient documentation

## 2012-01-19 LAB — BASIC METABOLIC PANEL
CO2: 27 mEq/L (ref 19–32)
Chloride: 106 mEq/L (ref 96–112)
Creatinine, Ser: 0.8 mg/dL (ref 0.4–1.2)
GFR: 85.23 mL/min (ref >60.00)

## 2012-01-19 LAB — LIPID PANEL
HDL: 48.7 mg/dL (ref >39.00)
VLDL: 11.6 mg/dL (ref 0.0–40.0)

## 2012-01-19 LAB — LDL CHOLESTEROL, DIRECT: Direct LDL: 170.1 mg/dL

## 2012-01-19 MED ORDER — SERTRALINE HCL 50 MG PO TABS: 50.0000 mg | ORAL_TABLET | Freq: Every day | ORAL | Status: DC

## 2012-01-19 NOTE — Assessment & Plan Note (Signed)
Preventative protocols reviewed and updated unless pt declined. Discussed healthy diet/lifestyle. Well woman with OBGYN yearly.

## 2012-01-19 NOTE — Patient Instructions (Signed)
Good to see you today, call us with quesitons. Return in 1-2 years for next checkup. Blood work today - we will call you with results.

## 2012-01-19 NOTE — Progress Notes (Signed)
Subjective:    Patient ID: Ashley Murray, female    DOB: [birth year], 36 y.o.   MRN: [id number]  HPI CC: CPE  HLD - tolerating red yeast rice, takes 2 capsules nightly.  no myalgias  Depression - trying to taper off zoloft.    preventative:  flu shot - to get at [facility] yearly tetanus - 2011 well woman exam - [facility], paps there as well as breast.  Pap smear 10/2011 normal.  Mammogram scheduled for next week.  Has had mammograms because mother has had breast cancer. mom with BRCA gene negative  Seat belt use 100%. Sunscreen use discussed.  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  Past histories reviewed and updated if relevant as below. Patient Active Problem List  Diagnosis  . HYPERLIPIDEMIA  . VARICOSE VEINS, LOWER EXTREMITIES  . ALLERGIC RHINITIS  . RHABDOMYOLYSIS  . SCOLIOSIS, MILD  . MURMUR  . LIVER FUNCTION TESTS, ABNORMAL  . STRAIN, CHEST WALL  . ANXIETY  . PALPITATIONS  . HEADACHE, TENSION  . Subacromial bursitis   Past Medical History  Diagnosis Date  . Tension headache   . HLD (hyperlipidemia)   . Depression    Past Surgical History  Procedure Date  . Wisdom tooth extraction   . Other surgical history     Hospitalized [facility] 2009 for muscle problems, EMGs of both upper arms  neg 09/12/07-Dr Chestine Spore, neuro abd, US WNL 04/2009   History  Substance Use Topics  . Smoking status: Never Smoker   . Smokeless tobacco: Never Used  . Alcohol Use: Yes     1-2 glasses of wine a week   Family History  Problem Relation Age of Onset  . Cancer Mother       BCRA  . Coronary artery disease Paternal Grandfather     Bypass at older age  . Hyperlipidemia Father   . Diabetes Neg Hx   . Stroke Neg Hx   . Hypertension Neg Hx    No Known Allergies Current Outpatient Prescriptions on File Prior to Visit  Medication Sig Dispense Refill  . ibuprofen (ADVIL,MOTRIN) 400 MG tablet Take 400 mg by mouth 2 (two) times daily as needed.        . Multiple Vitamin (MULTIVITAMIN) tablet Take 1 tablet by mouth daily.        . Red Yeast Rice Extract 600 MG CAPS Take 2 capsules by mouth daily.       Marland Kitchen DISCONTD: sertraline (ZOLOFT) 50 MG tablet Take 50 mg by mouth daily. Tapering off         Review of Systems  Constitutional: Negative for fever, chills, activity change, appetite change, fatigue and unexpected weight change.  HENT: Negative for hearing loss and neck pain.   Eyes: Negative for visual disturbance.  Respiratory: Negative for cough, chest tightness, shortness of breath and wheezing.   Cardiovascular: Negative for chest pain, palpitations and leg swelling.  Gastrointestinal: Negative for nausea, vomiting, abdominal pain, diarrhea, constipation, blood in stool and abdominal distention.  Genitourinary: Negative for hematuria and difficulty urinating.  Musculoskeletal: Negative for myalgias and arthralgias.  Skin: Negative  for rash.  Neurological: Negative for dizziness, seizures, syncope and headaches.  Hematological: Does not bruise/bleed easily.  Psychiatric/Behavioral: Negative for dysphoric mood. The patient is not nervous/anxious.        Objective:   Physical Exam  Nursing note and vitals reviewed. Constitutional: She is oriented to person, place, and time. She appears well-developed and well-nourished. No distress.  HENT:  Head: Normocephalic and atraumatic.  Right Ear: Hearing, tympanic membrane, external ear and ear canal normal.  Left Ear: Hearing, tympanic membrane, external ear and ear canal normal.  Nose: Nose normal.   Mouth/Throat: Uvula is midline, oropharynx is clear and moist and mucous membranes are normal. No oropharyngeal exudate, posterior oropharyngeal edema, posterior oropharyngeal erythema or tonsillar abscesses.  Eyes: Conjunctivae and EOM are normal. Pupils are equal, round, and reactive to light. No scleral icterus.  Neck: Normal range of motion. Neck supple. No thyromegaly present.  Cardiovascular: Normal rate, regular rhythm, normal heart sounds and intact distal pulses.   No murmur heard. Pulses:      Radial pulses are 2+ on the right side, and 2+ on the left side.  Pulmonary/Chest: Effort normal and breath sounds normal. No respiratory distress. She has no wheezes. She has no rales.  Abdominal: Soft. Bowel sounds are normal. She exhibits no distension and no mass. There is no tenderness. There is no rebound and no guarding.  Musculoskeletal: Normal range of motion. She exhibits no edema.  Lymphadenopathy:    She has no cervical adenopathy.  Neurological: She is alert and oriented to person, place, and time.       CN grossly intact, station and gait intact  Skin: Skin is warm and dry. No rash noted.  Psychiatric: She has a normal mood and affect. Her behavior is normal. Judgment and thought content normal.      Assessment & Plan:

## 2012-01-19 NOTE — Assessment & Plan Note (Signed)
Gets yearly mammograms.

## 2012-01-19 NOTE — Assessment & Plan Note (Signed)
Tapering off zoloft, doing well.  Slow taper as previously came off too quickly.

## 2012-01-19 NOTE — Assessment & Plan Note (Signed)
Tolerant of red yeast rice.  Continue.  Check FLP today.

## 2013-03-13 ENCOUNTER — Ambulatory Visit: Payer: Commercial Indemnity | Admitting: Family Medicine

## 2013-03-19 ENCOUNTER — Encounter: Payer: Self-pay | Admitting: Internal Medicine

## 2013-03-19 ENCOUNTER — Ambulatory Visit (INDEPENDENT_AMBULATORY_CARE_PROVIDER_SITE_OTHER): Payer: BC Managed Care – PPO | Admitting: Internal Medicine

## 2013-03-19 VITALS — BP 108/68 | HR 76 | Temp 98.1°F | Wt 131.0 lb

## 2013-03-19 DIAGNOSIS — W19XXXD Unspecified fall, subsequent encounter: Secondary | ICD-10-CM

## 2013-03-19 DIAGNOSIS — W19XXXA Unspecified fall, initial encounter: Secondary | ICD-10-CM

## 2013-03-19 DIAGNOSIS — M79609 Pain in unspecified limb: Secondary | ICD-10-CM

## 2013-03-19 DIAGNOSIS — M79671 Pain in right foot: Secondary | ICD-10-CM

## 2013-03-19 NOTE — Progress Notes (Signed)
Subjective:    Patient ID: Ashley Murray, female    DOB: July 02, 1976, 37 y.o.   MRN: [id number]  HPI  Pt presents to the clinic today with c/o right foot pain. This orginally started 6 days ago. She did experiencine a fall while she was walking her dog. She tripped over the leash and her right ankle twisted. She went to urgent care the next morning because she noticed swelling and pain. Xray did not show any fractures. The pain and swelling seemed to have gotten better over the weekend but she has noticed and increase in pain on the top of the foot and swelling. She does have to walk a lot at word. She has only taken Advil which has helped.  Review of Systems      Past Medical History  Diagnosis Date  . Tension headache   . HLD (hyperlipidemia)   . Depression     Current Outpatient Prescriptions  Medication Sig Dispense Refill  . ibuprofen (ADVIL,MOTRIN) 400 MG tablet Take 400 mg by mouth 2 (two) times daily as needed.        . Multiple Vitamin (MULTIVITAMIN) tablet Take 1 tablet by mouth daily.        . Red Yeast Rice Extract 600 MG CAPS Take 2 capsules by mouth daily.       . sertraline (ZOLOFT) 50 MG tablet Take 1 tablet (50 mg total) by mouth daily.  30 tablet  0   No current facility-administered medications for this visit.    No Known Allergies  Family History  Problem Relation Age of Onset  . Cancer Mother     BCRA  . Coronary artery disease Paternal Grandfather     Bypass at older age  . Hyperlipidemia Father   . Diabetes Neg Hx   . Stroke Neg Hx   . Hypertension Neg Hx     History   Social History  . Marital Status: Married    Spouse Name: N/A    Number of Children: N/A  . Years of Education: N/A   Occupational History  . Not on file.   Social History Main Topics  . Smoking status: Never Smoker   . Smokeless tobacco: Never Used  . Alcohol Use: Yes     Comment: 1-2 glasses of wine a week  . Drug Use: No  . Sexual Activity: Not on file   Other  Topics Concern  . Not on file   Social History Narrative   caffeine: 1 cup coffee/day, some tea in summer   Marital Status: Married   Children: 2-- (2008 and 2005)   Lives with 2 sons and husband, 1 dog, 1 cat   Occupation: Pharmacologist at [employer].   Husband is Interior and spatial designer of dining services at [employer]--6/09 no longer at [employer]   Activity: walks every other day, plays softball weekly   Diet: good water, daily fruits/vegetables, red meat 1-2x/wk, fish rarely     Constitutional: Denies fever, malaise, fatigue, headache or abrupt weight changes.  Musculoskeletal: Pt reports pain in top of right foot. Denies decrease in range of motion, difficulty with gait, muscle pain or joint pain and swelling.  Skin: Pt reports bruising and swelling of top of right foot. Denies redness, rashes, lesions or ulcercations.    No other specific complaints in a complete review of systems (except as listed in HPI above).  Objective:   Physical Exam    BP 108/68  Pulse 76  Temp(Src) 98.1 F (36.7  C) (Tympanic)  Wt 131 lb (59.421 kg)  BMI 23.95 kg/m2  SpO2 98% Wt Readings from Last 3 Encounters:  03/19/13 131 lb (59.421 kg)  01/19/12 134 lb 4 oz (60.895 kg)  11/28/11 131 lb 12 oz (59.761 kg)    General: Appears her stated age, well developed, well nourished in NAD. Skin: Warm, dry and intact. No rashes, lesions or ulcerations noted. Ecchymosis noted at the base of the 2nd, 3rd and 4th toes. Cardiovascular: Normal rate and rhythm. S1,S2 noted.  No murmur, rubs or gallops noted. No JVD or BLE edema. No carotid bruits noted. Pulmonary/Chest: Normal effort and positive vesicular breath sounds. No respiratory distress. No wheezes, rales or ronchi noted.  Musculoskeletal: Normal range of motion. No signs of joint swelling. No difficulty with gait.    BMET    Component Value Date/Time   NA 139 01/19/2012 0859   K 4.4 01/19/2012 0859   CL 106 01/19/2012 0859   CO2 27 01/19/2012 0859    GLUCOSE 85 01/19/2012 0859   BUN 20 01/19/2012 0859   CREATININE 0.8 01/19/2012 0859   CALCIUM 9.1 01/19/2012 0859   GFRNONAA 85.95 06/29/2010 0910   GFRAA 106 08/27/2008 0921    Lipid Panel     Component Value Date/Time   CHOL 241* 01/19/2012 0859   TRIG 58.0 01/19/2012 0859   HDL 48.70 01/19/2012 0859   CHOLHDL 5 01/19/2012 0859   VLDL 11.6 01/19/2012 0859   LDLCALC 106* 06/29/2010 0910    CBC    Component Value Date/Time   WBC 6.4 08/01/2007 0902   RBC 4.45 08/01/2007 0902   HGB 13.8 08/01/2007 0902   HCT 39.6 08/01/2007 0902   PLT 256 08/01/2007 0902   MCV 89.2 08/01/2007 0902   MCHC 34.9 08/01/2007 0902   RDW 11.8 08/01/2007 0902   MONOABS 0.5 08/01/2007 0902   EOSABS 0.3 08/01/2007 0902   BASOSABS 0.0 08/01/2007 0902    Hgb A1C No results found for this basename: HGBA1C       Assessment & Plan:   Pain and swelling of right foot secondary to injury:  Xray has already been done at urgent care Continue Advil as needed for pain RICE instructions given Try to stay off of it as much as possible.  If pain, swelling persist for more than 5-7 days, RTC for followup

## 2013-03-19 NOTE — Patient Instructions (Signed)
RICE: Routine Care for Injuries  The routine care of many injuries includes Rest, Ice, Compression, and Elevation (RICE).  HOME CARE INSTRUCTIONS   Rest is needed to allow your body to heal. Routine activities can usually be resumed when comfortable. Injured tendons and bones can take up to 6 weeks to heal. Tendons are the cord-like structures that attach muscle to bone.   Ice following an injury helps keep the swelling down and reduces pain.   Put ice in a plastic bag.   Place a towel between your skin and the bag.   Leave the ice on for 15-20 minutes, 3-4 times a day. Do this while awake, for the first 24 to 48 hours. After that, continue as directed by your caregiver.   Compression helps keep swelling down. It also gives support and helps with discomfort. If an elastic bandage has been applied, it should be removed and reapplied every 3 to 4 hours. It should not be applied tightly, but firmly enough to keep swelling down. Watch fingers or toes for swelling, bluish discoloration, coldness, numbness, or excessive pain. If any of these problems occur, remove the bandage and reapply loosely. Contact your caregiver if these problems continue.   Elevation helps reduce swelling and decreases pain. With extremities, such as the arms, hands, legs, and feet, the injured area should be placed near or above the level of the heart, if possible.  SEEK IMMEDIATE MEDICAL CARE IF:   You have persistent pain and swelling.   You develop redness, numbness, or unexpected weakness.   Your symptoms are getting worse rather than improving after several days.  These symptoms may indicate that further evaluation or further X-rays are needed. Sometimes, X-rays may not show a small broken bone (fracture) until 1 week or 10 days later. Make a follow-up appointment with your caregiver. Ask when your X-ray results will be ready. Make sure you get your X-ray results.  Document Released: 10/08/2000 Document Revised: 09/18/2011 Document  Reviewed: 11/25/2010  ExitCare Patient Information 2014 [employer].

## 2013-04-23 ENCOUNTER — Encounter: Payer: BC Managed Care – PPO | Admitting: Family Medicine

## 2013-04-23 DIAGNOSIS — Z0289 Encounter for other administrative examinations: Secondary | ICD-10-CM

## 2013-08-27 ENCOUNTER — Encounter: Payer: BC Managed Care – PPO | Admitting: Family Medicine

## 2013-10-07 ENCOUNTER — Ambulatory Visit (INDEPENDENT_AMBULATORY_CARE_PROVIDER_SITE_OTHER): Payer: 59 | Admitting: Family Medicine

## 2013-10-07 ENCOUNTER — Encounter: Payer: Self-pay | Admitting: Family Medicine

## 2013-10-07 VITALS — BP 114/76 | HR 62 | Temp 97.4°F | Wt 128.4 lb

## 2013-10-07 DIAGNOSIS — Z0000 Encounter for general adult medical examination without abnormal findings: Secondary | ICD-10-CM

## 2013-10-07 DIAGNOSIS — E782 Mixed hyperlipidemia: Secondary | ICD-10-CM

## 2013-10-07 DIAGNOSIS — Z8051 Family history of malignant neoplasm of kidney: Secondary | ICD-10-CM

## 2013-10-07 LAB — POCT URINALYSIS DIPSTICK
BILIRUBIN UA: NEGATIVE
Blood, UA: NEGATIVE
GLUCOSE UA: NEGATIVE
Ketones, UA: NEGATIVE
Leukocytes, UA: NEGATIVE
Nitrite, UA: NEGATIVE
PROTEIN UA: NEGATIVE
Spec Grav, UA: 1.015
UROBILINOGEN UA: 0.2
pH, UA: 6.5

## 2013-10-07 NOTE — Progress Notes (Signed)
Pre visit review using our clinic review tool, if applicable. No additional management support is needed unless otherwise documented below in the visit note. 

## 2013-10-07 NOTE — Assessment & Plan Note (Signed)
Check UA today.  Consider renal ultrasound in future.  8 age 40 yo family member.

## 2013-10-07 NOTE — Assessment & Plan Note (Signed)
Mammograms regularly.

## 2013-10-07 NOTE — Assessment & Plan Note (Signed)
Preventative protocols reviewed and updated unless pt declined. Discussed healthy diet and lifestyle.  

## 2013-10-07 NOTE — Assessment & Plan Note (Signed)
Check FLP when returns fasting. 

## 2013-10-07 NOTE — Patient Instructions (Signed)
Let's check urinalysis today given family history. In the future we may get a baseline kidney ultrasound. Return at your convenience fasting for blood work one morning to check cholesterol levels.

## 2013-10-07 NOTE — Assessment & Plan Note (Signed)
Stable off zoloft.  

## 2013-10-07 NOTE — Progress Notes (Signed)
 BP 114/76  Pulse 62  Temp(Src) 97.4 F (36.3 C) (Tympanic)  Wt 128 lb 6.4 oz (58.242 kg)  SpO2 94%  LMP 09/23/2013   CC: CPE  Subjective:    Patient ID: Ashley Murray, female    DOB: 08/21/1975, 37 y.o.   MRN: [id number]  HPI: Ashley Murray is a 37 y.o. female presenting on 10/07/2013 for Annual Exam   No questions/ concerns today.  Recently father with kidney cancer scare, but states biopsy was benign.  + fmhx kidney cancer (aunt and cousin). Working on diet since christmas.  More fruits/vegetables, fish once a week.  Preventative: flu shot - [facility] yearly tetanus - 2011  well woman exam - [facility], paps there as well as breast. Pap smear 10/2012 WNL. Mammogram scheduled normal last week.  Has had mammograms because mother has had breast cancer. mom with BRCA gene negative  Seat belt use 100%.  Sunscreen use discussed.   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  Husband is director of dining services at [employer]--6/09 no longer at [employer]  Activity: walks every other day, plays softball weekly  Diet: good water, daily fruits/vegetables, red meat 1-2x/wk, fish rarely  Relevant past medical, surgical, family and social history reviewed and updated as indicated.  Allergies and medications reviewed and updated. Current Outpatient Prescriptions on File Prior to Visit  Medication Sig  . ibuprofen (ADVIL,MOTRIN) 400 MG tablet Take 400 mg by mouth 2 (two) times daily as needed.    . Multiple Vitamin (MULTIVITAMIN) tablet Take 1 tablet by mouth daily.    . Red Yeast Rice Extract 600 MG CAPS Take 2 capsules by mouth daily.    No current facility-administered medications on file prior to visit.    Review of Systems  Constitutional: Negative for fever, chills, activity change, appetite change, fatigue and unexpected  weight change.  HENT: Negative for hearing loss.   Eyes: Negative for visual disturbance.  Respiratory: Negative for cough, chest tightness, shortness of breath and wheezing.   Cardiovascular: Negative for chest pain, palpitations and leg swelling.  Gastrointestinal: Negative for nausea, vomiting, abdominal pain, diarrhea, constipation, blood in stool and abdominal distention.  Genitourinary: Negative for hematuria and difficulty urinating.  Musculoskeletal: Negative for arthralgias, myalgias and neck pain.  Skin: Negative for rash.  Neurological: Negative for dizziness, seizures, syncope and headaches.  Hematological: Negative for adenopathy. Does not bruise/bleed easily.  Psychiatric/Behavioral: Negative for dysphoric mood. The patient is not nervous/anxious.    Per HPI unless specifically indicated above    Objective:    BP 114/76  Pulse 62  Temp(Src) 97.4 F (36.3 C) (Tympanic)  Wt 128 lb 6.4 oz (58.242 kg)  SpO2 94%  LMP 09/23/2013  Physical Exam  Nursing note and vitals reviewed. Constitutional: She is oriented to person, place, and time. She appears well-developed and well-nourished. No distress.  HENT:  Head: Normocephalic and atraumatic.  Right Ear: Hearing, tympanic membrane, external ear and ear canal normal.  Left Ear: Hearing, tympanic membrane, external ear and ear canal normal.  Nose: Nose normal.  Mouth/Throat: Uvula is midline, oropharynx is clear and moist and mucous membranes are normal. No oropharyngeal exudate, posterior oropharyngeal edema or posterior oropharyngeal erythema.  Eyes: Conjunctivae and EOM are normal. Pupils are equal, round, and reactive to light. No scleral icterus.  Neck: Normal   range of motion. Neck supple. No thyromegaly present.  Cardiovascular: Normal rate, regular rhythm, normal heart sounds and intact distal pulses.   No murmur heard. Pulses:      Radial pulses are 2+ on the right side, and 2+ on the left side.  Pulmonary/Chest:  Effort normal and breath sounds normal. No respiratory distress. She has no wheezes. She has no rales.  Abdominal: Soft. Bowel sounds are normal. She exhibits no distension and no mass. There is no tenderness. There is no rebound and no guarding.  Musculoskeletal: Normal range of motion. She exhibits no edema.  Lymphadenopathy:    She has no cervical adenopathy.  Neurological: She is alert and oriented to person, place, and time.  CN grossly intact, station and gait intact  Skin: Skin is warm and dry. No rash noted.  Psychiatric: She has a normal mood and affect. Her behavior is normal. Judgment and thought content normal.   Results for orders placed in visit on 01/19/12  BASIC METABOLIC PANEL      Result Value Ref Range   Sodium 139  135 - 145 mEq/L   Potassium 4.4  3.5 - 5.1 mEq/L   Chloride 106  96 - 112 mEq/L   CO2 27  19 - 32 mEq/L   Glucose, Bld 85  70 - 99 mg/dL   BUN 20  6 - 23 mg/dL   Creatinine, Ser 0.8  0.4 - 1.2 mg/dL   Calcium 9.1  8.4 - 10.5 mg/dL   GFR 85.23  >60.00 mL/min  LIPID PANEL      Result Value Ref Range   Cholesterol 241 (*) 0 - 200 mg/dL   Triglycerides 58.0  0.0 - 149.0 mg/dL   HDL 48.70  >39.00 mg/dL   VLDL 11.6  0.0 - 40.0 mg/dL   Total CHOL/HDL Ratio 5    LDL CHOLESTEROL, DIRECT      Result Value Ref Range   Direct LDL 170.1        Assessment & Plan:   Problem List Items Addressed This Visit   HYPERLIPIDEMIA     Check FLP when returns fasting.    Relevant Orders      Lipid panel      Comprehensive metabolic panel      TSH   Healthcare maintenance - Primary     Preventative protocols reviewed and updated unless pt declined. Discussed healthy diet and lifestyle.     Family history of kidney cancer     Check UA today.  Consider renal ultrasound in future.  Earliest age 60 yo family member.    Relevant Orders      POCT urinalysis dipstick       Follow up plan: Return in about 1 year (around 10/08/2014), or as needed, for physical.   

## 2013-10-08 ENCOUNTER — Other Ambulatory Visit (INDEPENDENT_AMBULATORY_CARE_PROVIDER_SITE_OTHER): Payer: 59

## 2013-10-08 DIAGNOSIS — E782 Mixed hyperlipidemia: Secondary | ICD-10-CM

## 2013-10-08 LAB — COMPREHENSIVE METABOLIC PANEL
ALBUMIN: 4.4 g/dL (ref 3.5–5.2)
ALT: 24 U/L (ref 0–35)
AST: 21 U/L (ref 0–37)
Alkaline Phosphatase: 49 U/L (ref 39–117)
BUN: 16 mg/dL (ref 6–23)
CALCIUM: 9.5 mg/dL (ref 8.4–10.5)
CHLORIDE: 103 meq/L (ref 96–112)
CO2: 27 mEq/L (ref 19–32)
CREATININE: 0.9 mg/dL (ref 0.4–1.2)
GFR: 75.67 mL/min (ref >60.00)
Glucose, Bld: 89 mg/dL (ref 70–99)
POTASSIUM: 4.2 meq/L (ref 3.5–5.1)
Sodium: 138 mEq/L (ref 135–145)
TOTAL PROTEIN: 6.9 g/dL (ref 6.0–8.3)
Total Bilirubin: 0.9 mg/dL (ref 0.3–1.2)

## 2013-10-08 LAB — LIPID PANEL
CHOL/HDL RATIO: 4
Cholesterol: 220 mg/dL — ABNORMAL HIGH (ref 0–200)
HDL: 52.3 mg/dL (ref >39.00)
LDL Cholesterol: 158 mg/dL — ABNORMAL HIGH (ref 0–99)
Triglycerides: 47 mg/dL (ref 0.0–149.0)
VLDL: 9.4 mg/dL (ref 0.0–40.0)

## 2013-10-08 LAB — TSH: TSH: 0.97 u[IU]/mL (ref 0.35–5.50)

## 2013-10-08 LAB — HM MAMMOGRAPHY: HM Mammogram: NORMAL

## 2013-10-21 ENCOUNTER — Encounter: Payer: Self-pay | Admitting: *Deleted

## 2013-10-23 ENCOUNTER — Encounter: Payer: Self-pay | Admitting: Internal Medicine

## 2013-10-24 ENCOUNTER — Telehealth: Payer: Self-pay | Admitting: Family Medicine

## 2013-10-24 MED ORDER — ATORVASTATIN CALCIUM 20 MG PO TABS: 20.0000 mg | ORAL_TABLET | Freq: Every day | ORAL | Status: DC

## 2013-10-24 NOTE — Telephone Encounter (Signed)
Spoke to pt regarding her test results.  She would like to increase cholesterol medication at this point.  Dr. Danise Mina is out of office.  Routed to Dr. Diona Browner for recommendation.

## 2013-10-24 NOTE — Telephone Encounter (Signed)
Spoke with Ashley Murray.  She would like to stop the red yeast rice and start atorvastatin 20 mg.  She will follow up with Dr. Danise Mina in three months with fasting labs prior.  Prescription for atorvastatin 20 mg sent to [facility] [place].

## 2013-10-24 NOTE — Telephone Encounter (Signed)
She is not on max red yeast rice. She could either increase to 1200 mg BID or  As I think this would likely not be strong enough ...we could stop this and start a prescription med likely atorvastain 20 m daily and recheck in 3 months with fasting labs.  let me know.

## 2013-10-29 NOTE — Telephone Encounter (Signed)
Error

## 2014-03-18 ENCOUNTER — Other Ambulatory Visit (INDEPENDENT_AMBULATORY_CARE_PROVIDER_SITE_OTHER): Payer: 59

## 2014-03-18 ENCOUNTER — Other Ambulatory Visit: Payer: Self-pay | Admitting: Family Medicine

## 2014-03-18 DIAGNOSIS — E782 Mixed hyperlipidemia: Secondary | ICD-10-CM

## 2014-03-18 LAB — LIPID PANEL
Cholesterol: 192 mg/dL (ref 0–200)
HDL: 37 mg/dL — ABNORMAL LOW (ref >39.00)
LDL Cholesterol: 136 mg/dL — ABNORMAL HIGH (ref 0–99)
NONHDL: 155
Total CHOL/HDL Ratio: 5
Triglycerides: 93 mg/dL (ref 0.0–149.0)
VLDL: 18.6 mg/dL (ref 0.0–40.0)

## 2014-03-19 ENCOUNTER — Encounter: Payer: Self-pay | Admitting: Family Medicine

## 2014-03-19 MED ORDER — ATORVASTATIN CALCIUM 20 MG PO TABS: 20.0000 mg | ORAL_TABLET | Freq: Every day | ORAL | Status: DC

## 2014-06-18 ENCOUNTER — Telehealth: Payer: Self-pay | Admitting: Family Medicine

## 2014-06-18 NOTE — Telephone Encounter (Signed)
LVM TO R/S LABS THAT WERE SCHED FOR 3/25

## 2014-06-22 ENCOUNTER — Encounter: Payer: Self-pay | Admitting: Family Medicine

## 2014-09-26 ENCOUNTER — Other Ambulatory Visit: Payer: Self-pay | Admitting: Family Medicine

## 2014-09-26 DIAGNOSIS — E782 Mixed hyperlipidemia: Secondary | ICD-10-CM

## 2014-09-30 ENCOUNTER — Other Ambulatory Visit: Payer: Self-pay

## 2014-10-02 ENCOUNTER — Other Ambulatory Visit: Payer: 59

## 2014-10-05 ENCOUNTER — Other Ambulatory Visit (INDEPENDENT_AMBULATORY_CARE_PROVIDER_SITE_OTHER): Payer: 59

## 2014-10-05 DIAGNOSIS — E782 Mixed hyperlipidemia: Secondary | ICD-10-CM | POA: Diagnosis not present

## 2014-10-05 LAB — COMPREHENSIVE METABOLIC PANEL
ALT: 18 U/L (ref 0–35)
AST: 19 U/L (ref 0–37)
Albumin: 4.2 g/dL (ref 3.5–5.2)
Alkaline Phosphatase: 54 U/L (ref 39–117)
BILIRUBIN TOTAL: 0.6 mg/dL (ref 0.2–1.2)
BUN: 17 mg/dL (ref 6–23)
CO2: 28 mEq/L (ref 19–32)
CREATININE: 0.89 mg/dL (ref 0.40–1.20)
Calcium: 9.4 mg/dL (ref 8.4–10.5)
Chloride: 105 mEq/L (ref 96–112)
GFR: 74.32 mL/min (ref >60.00)
GLUCOSE: 85 mg/dL (ref 70–99)
Potassium: 4.3 mEq/L (ref 3.5–5.1)
SODIUM: 139 meq/L (ref 135–145)
Total Protein: 6.8 g/dL (ref 6.0–8.3)

## 2014-10-05 LAB — LIPID PANEL
Cholesterol: 127 mg/dL (ref 0–200)
HDL: 52.8 mg/dL (ref >39.00)
LDL CALC: 64 mg/dL (ref 0–99)
NONHDL: 74.2
Total CHOL/HDL Ratio: 2
Triglycerides: 50 mg/dL (ref 0.0–149.0)
VLDL: 10 mg/dL (ref 0.0–40.0)

## 2014-10-09 ENCOUNTER — Ambulatory Visit (INDEPENDENT_AMBULATORY_CARE_PROVIDER_SITE_OTHER): Payer: 59 | Admitting: Family Medicine

## 2014-10-09 ENCOUNTER — Encounter: Payer: Self-pay | Admitting: Family Medicine

## 2014-10-09 VITALS — BP 100/60 | HR 72 | Temp 98.0°F | Ht 62.0 in | Wt 125.5 lb

## 2014-10-09 DIAGNOSIS — E782 Mixed hyperlipidemia: Secondary | ICD-10-CM

## 2014-10-09 DIAGNOSIS — Z0000 Encounter for general adult medical examination without abnormal findings: Secondary | ICD-10-CM

## 2014-10-09 DIAGNOSIS — Z8051 Family history of malignant neoplasm of kidney: Secondary | ICD-10-CM | POA: Diagnosis not present

## 2014-10-09 DIAGNOSIS — Z803 Family history of malignant neoplasm of breast: Secondary | ICD-10-CM

## 2014-10-09 DIAGNOSIS — F411 Generalized anxiety disorder: Secondary | ICD-10-CM

## 2014-10-09 LAB — POCT URINALYSIS DIPSTICK
Bilirubin, UA: NEGATIVE
Blood, UA: NEGATIVE
Glucose, UA: NEGATIVE
KETONES UA: NEGATIVE
Leukocytes, UA: NEGATIVE
Nitrite, UA: NEGATIVE
PH UA: 6
Urobilinogen, UA: 1

## 2014-10-09 MED ORDER — ATORVASTATIN CALCIUM 10 MG PO TABS: 10.0000 mg | ORAL_TABLET | Freq: Every day | ORAL | Status: DC

## 2014-10-09 NOTE — Assessment & Plan Note (Addendum)
Great control on latest FLP - decrease lipitor to 10mg  daily Healthy diet changes at home.

## 2014-10-09 NOTE — Assessment & Plan Note (Signed)
Preventative protocols reviewed and updated unless pt declined. Discussed healthy diet and lifestyle.  

## 2014-10-09 NOTE — Progress Notes (Signed)
BP 100/60 mmHg  Pulse 72  Temp(Src) 98 F (36.7 C) (Oral)  Ht '5\' 2"'  (1.575 m)  Wt 125 lb 8 oz (56.926 kg)  BMI 22.95 kg/m2  LMP 09/30/2014   CC: CPE  Subjective:    Patient ID: Ashley Murray, female    DOB: February 01, 1976, 39 y.o.   MRN: [id number]  HPI: Ashley Murray is a 39 y.o. female presenting on 10/09/2014 for Annual Exam   Preventative: well woman exam - [facility], paps there as well as breast. Pap smear 10/2012 WNL. Normal mammogram last year. Has had mammograms because mother has had breast cancer. mom with BRCA gene negative  flu shot - [facility] tetanus - 2011  Seat belt use 100%.  Sunscreen use discussed. No changing moles on skin.  caffeine: 1 cup coffee/day, some tea in summer  Marital Status: Married  Children: 2-- (2008 and 2005)  Lives with 2 sons and husband, 1 dog, 1 cat  Occupation: IT sales professional at [employer].  Husband is Mudlogger of dining services at [employer]--6/09 no longer at [employer]  Activity: walks every other day, plays softball weekly  Diet: good water, daily fruits/vegetables, red meat 1-2x/wk, fish rarely   Relevant past medical, surgical, family and social history reviewed and updated as indicated. Interim medical history since our last visit reviewed. Allergies and medications reviewed and updated. Current Outpatient Prescriptions on File Prior to Visit  Medication Sig  . ibuprofen (ADVIL,MOTRIN) 400 MG tablet Take 400 mg by mouth 2 (two) times daily as needed.    . Multiple Vitamin (MULTIVITAMIN) tablet Take 1 tablet by mouth daily.     No current facility-administered medications on file prior to visit.    Review of Systems  Constitutional: Negative for fever, chills, activity change, appetite change, fatigue and unexpected weight change.  HENT: Negative for hearing loss.   Eyes: Negative for visual disturbance.  Respiratory: Negative for cough, chest tightness, shortness of breath and wheezing.   Cardiovascular:  Negative for chest pain, palpitations and leg swelling.  Gastrointestinal: Negative for nausea, vomiting, abdominal pain, diarrhea, constipation, blood in stool and abdominal distention.  Genitourinary: Negative for hematuria and difficulty urinating.  Musculoskeletal: Negative for myalgias, arthralgias and neck pain.  Skin: Negative for rash.  Neurological: Negative for dizziness, seizures, syncope and headaches.  Hematological: Negative for adenopathy. Does not bruise/bleed easily.  Psychiatric/Behavioral: Negative for dysphoric mood. The patient is not nervous/anxious.    Per HPI unless specifically indicated above     Objective:    BP 100/60 mmHg  Pulse 72  Temp(Src) 98 F (36.7 C) (Oral)  Ht '5\' 2"'  (1.575 m)  Wt 125 lb 8 oz (56.926 kg)  BMI 22.95 kg/m2  LMP 09/30/2014  Wt Readings from Last 3 Encounters:  10/09/14 125 lb 8 oz (56.926 kg)  10/07/13 128 lb 6.4 oz (58.242 kg)  03/19/13 131 lb (59.421 kg)    Physical Exam  Constitutional: She is oriented to person, place, and time. She appears well-developed and well-nourished. No distress.  HENT:  Head: Normocephalic and atraumatic.  Right Ear: Hearing, tympanic membrane, external ear and ear canal normal.  Left Ear: Hearing, tympanic membrane, external ear and ear canal normal.  Nose: Nose normal.  Mouth/Throat: Uvula is midline, oropharynx is clear and moist and mucous membranes are normal. No oropharyngeal exudate, posterior oropharyngeal edema or posterior oropharyngeal erythema.  Eyes: Conjunctivae and EOM are normal. Pupils are equal, round, and reactive to light. No scleral icterus.  Neck: Normal  range of motion. Neck supple. No thyromegaly present.  Cardiovascular: Normal rate, regular rhythm, normal heart sounds and intact distal pulses.   No murmur heard. Pulses:      Radial pulses are 2+ on the right side, and 2+ on the left side.  Pulmonary/Chest: Effort normal and breath sounds normal. No respiratory distress.  She has no wheezes. She has no rales.  Abdominal: Soft. Bowel sounds are normal. She exhibits no distension and no mass. There is no tenderness. There is no rebound and no guarding.  Musculoskeletal: Normal range of motion. She exhibits no edema.  Lymphadenopathy:    She has no cervical adenopathy.  Neurological: She is alert and oriented to person, place, and time.  CN grossly intact, station and gait intact  Skin: Skin is warm and dry. No rash noted.  Psychiatric: She has a normal mood and affect. Her behavior is normal. Judgment and thought content normal.  Nursing note and vitals reviewed.  Results for orders placed or performed in visit on 10/09/14  [facility] MAMMOGRAPHY  Result Value Ref Range   [facility] Mammogram normal per patient       Assessment & Plan:   Problem List Items Addressed This Visit    HYPERLIPIDEMIA    Great control on latest FLP - decrease lipitor to 10m daily Healthy diet changes at home.      Relevant Medications   atorvastatin (LIPITOR) tablet   Healthcare maintenance - Primary    Preventative protocols reviewed and updated unless pt declined. Discussed healthy diet and lifestyle.       GAD (generalized anxiety disorder)    Stable off ssri.      Family history of kidney cancer    Check UA today.      Family history of breast cancer in mother    [facility]          Follow up plan: Return in about 1 year (around 10/09/2015), or as needed, for annual exam, prior fasting for blood work.

## 2014-10-09 NOTE — Assessment & Plan Note (Signed)
Regular mammograms through [facility].

## 2014-10-09 NOTE — Progress Notes (Signed)
Pre visit review using our clinic review tool, if applicable. No additional management support is needed unless otherwise documented below in the visit note. 

## 2014-10-09 NOTE — Patient Instructions (Addendum)
Urinalysis today. Let's decrease lipitor to 10mg  daily - new dose will be at pharmacy. Congratulations on healthy changes - cholesterol levels are looking great! Return as needed or in 1 year for next physical.

## 2014-10-09 NOTE — Addendum Note (Signed)
Addended by: Royann Shivers A on: 10/09/2014 08:27 AM   Modules accepted: Orders

## 2014-10-09 NOTE — Assessment & Plan Note (Signed)
Check UA today.  

## 2014-10-09 NOTE — Assessment & Plan Note (Signed)
Stable off ssri.

## 2016-08-17 ENCOUNTER — Ambulatory Visit (INDEPENDENT_AMBULATORY_CARE_PROVIDER_SITE_OTHER): Payer: Self-pay | Admitting: Obstetrics and Gynecology

## 2016-08-17 ENCOUNTER — Encounter: Payer: Self-pay | Admitting: Obstetrics and Gynecology

## 2016-08-17 VITALS — BP 123/65 | HR 69 | Ht 62.0 in | Wt 129.2 lb

## 2016-08-17 DIAGNOSIS — Z30011 Encounter for initial prescription of contraceptive pills: Secondary | ICD-10-CM

## 2016-08-17 DIAGNOSIS — Z01419 Encounter for gynecological examination (general) (routine) without abnormal findings: Secondary | ICD-10-CM

## 2016-08-17 DIAGNOSIS — Z803 Family history of malignant neoplasm of breast: Secondary | ICD-10-CM

## 2016-08-17 MED ORDER — LEVONORGEST-ETH ESTRAD 91-DAY 0.15-0.03 &0.01 MG PO TABS: 1.0000 | ORAL_TABLET | Freq: Every day | ORAL | 3 refills | Status: DC

## 2016-08-17 NOTE — Progress Notes (Signed)
New 41 yo patient is here for an annual pap smear and breast exam. LPS 2015 and was WNL. Is due for a mammogram.

## 2016-08-17 NOTE — Progress Notes (Signed)
HPI:      [name] is a 41 y.o. [id number] who LMP was Patient's last menstrual period was 07/19/2016 (exact date).  Subjective: She presents today for her annual examination.  She 68 normal regular cycles. Her husband has vasectomy for birth control. Of significant note she has a family history of breast cancer in both her grandmother and her mother. Her mother has had bilateral mastectomies. She reports her mother has had BRCA testing and it is negative.    Hx: The following portions of the patient's history were reviewed and updated as appropriate:           She  has a past medical history of Depression; HLD (hyperlipidemia); and Tension headache. She  does not have any pertinent problems on file. She  has a past surgical history that includes Wisdom tooth extraction and Other surgical history. Her family history includes Cancer (age of onset: 23) in her mother; Cancer (age of onset: 55) in her cousin and paternal aunt; Coronary artery disease in her paternal grandfather; Hyperlipidemia in her father; Kidney disease (age of onset: 31) in her father. She  reports that she has never smoked. She has never used smokeless tobacco. She reports that she drinks alcohol. She reports that she does not use drugs. She has a current medication list which includes the following prescription(s): levonorgestrel-ethinyl estradiol.        ROS: Constitutional: Denied constitutional symptoms, night sweats, recent illness, fatigue, fever, insomnia and weight loss.  Eyes: Denied eye symptoms, eye pain, photophobia, vision change and visual disturbance.  Ears/Nose/Throat/Neck: Denied ear, nose, throat or neck symptoms, hearing loss, nasal discharge, sinus congestion and sore throat.  Cardiovascular: Denied cardiovascular symptoms, arrhythmia, chest pain/pressure, edema, exercise intolerance, orthopnea and palpitations.  Respiratory: Denied pulmonary symptoms, asthma, pleuritic pain, productive sputum, cough,  dyspnea and wheezing.  Gastrointestinal: Denied, gastro-esophageal reflux, melena, nausea and vomiting.  Genitourinary: Denied genitourinary symptoms including symptomatic vaginal discharge, pelvic relaxation issues, and urinary complaints.  Musculoskeletal: Denied musculoskeletal symptoms, stiffness, swelling, muscle weakness and myalgia.  Dermatologic: Denied dermatology symptoms, rash and scar.  Neurologic: Denied neurology symptoms, dizziness, headache, neck pain and syncope.  Psychiatric: Denied psychiatric symptoms, anxiety and depression.  Endocrine: Denied endocrine symptoms including hot flashes and night sweats.   Meds: She has a current medication list which includes the following prescription(s): levonorgestrel-ethinyl estradiol.  Objective: Vitals:   08/17/16 0814  BP: 123/65  Pulse: 69            Physical examination General NAD, Conversant  HEENT Atraumatic; Op clear with mmm.  Normo-cephalic. Pupils reactive. Anicteric sclerae  Thyroid/Neck Smooth without nodularity or enlargement. Normal ROM.  Neck Supple.  Skin No rashes, lesions or ulceration. Normal palpated skin turgor. No nodularity.  Breasts: No masses or discharge.  Symmetric.  No axillary adenopathy.  Lungs: Clear to auscultation.No rales or wheezes. Normal Respiratory effort, no retractions.  Heart: NSR.  No murmurs or rubs appreciated. No periferal edema  Abdomen: Soft.  Non-tender.  No masses.  No HSM. No hernia  Extremities: Moves all appropriately.  Normal ROM for age. No lymphadenopathy.  Neuro: Oriented to PPT.  Normal mood. Normal affect.     Pelvic:   Vulva: Normal appearance.  No lesions.  Vagina: No lesions or abnormalities noted.  Support: Normal pelvic support.  Urethra No masses tenderness or scarring.  Meatus Normal size without lesions or prolapse.  Cervix: Normal appearance.  No lesions. Friable   Anus: Normal exam.  No lesions.  Perineum: Normal exam.  No lesions.        Bimanual    Uterus: Normal size.  Non-tender.  Mobile.  AV.  Adnexae: No masses.  Non-tender to palpation.  Cul-de-sac: Negative for abnormality.     Assessment: 1. Well woman exam   2. Family history of breast cancer in mother   59. Initiation of OCP (BCP)       Plan:           1.  Basic Screening Recommendations The basic screening recommendations for asymptomatic women were discussed with the patient during her visit.  The age-appropriate recommendations were discussed with her and the rational for the tests reviewed.  When I am informed by the patient that another primary care physician has previously obtained the age-appropriate tests and they are up-to-date, only outstanding tests are ordered and referrals given as necessary.  Abnormal results of tests will be discussed with her when all of her results are completed. Pap performed-mammogram ordered. 2.  We have discussed the association of breast ovarian and colon cancer in detail. We have discussed the advantages of OCPs for ovarian cancer prevention. The literature regarding breast cancer and OCPs and they're on established association has been discussed as well. Recommendation of timely colonoscopy discussed. She has decided to start OCPs for their advantages with her menstrual periods and for their benefits regarding ovarian cancer risk. OCPs The risks /benefits of OCPs have been explained to the patient in detail.  Product literature has been given to her.  I have instructed her in the use of OCPs and have given her literature reinforcing this information.  I have explained to the patient that OCPs are not as effective for birth control during the first month of use, and that another form of contraception should be used during this time.  Both first-day start and Sunday start have been explained.  The risks and benefits of each was discussed.  She has been made aware of  the fact that other medications may affect the efficacy of OCPs.  I have  answered all of her questions, and I believe that she has an understanding of the effectiveness and use of OCPs.   Orders Orders Placed This Encounter  Procedures  . MM DIGITAL SCREENING BILATERAL     Meds ordered this encounter  Medications  . Levonorgestrel-Ethinyl Estradiol (AMETHIA,CAMRESE) 0.15-0.03 &0.01 MG tablet    Sig: Take 1 tablet by mouth at bedtime.    Dispense:  84 tablet    Refill:  3        F/U  Return in about 1 year (around 08/17/2017).  Finis Bud, M.D. 08/17/2016 9:02 AM

## 2016-08-19 LAB — PAP IG AND HPV HIGH-RISK
HPV, HIGH-RISK: NEGATIVE
PAP SMEAR COMMENT: 0

## 2016-08-22 ENCOUNTER — Telehealth: Payer: Self-pay

## 2016-08-22 NOTE — Telephone Encounter (Signed)
Message left on patient's voicemail to please return call.

## 2016-08-22 NOTE — Telephone Encounter (Signed)
-----   Message from Harlin Heys, MD sent at 08/21/2016  3:01 PM EST ----- Negative Pap and HPV

## 2016-08-23 ENCOUNTER — Telehealth: Payer: Self-pay

## 2016-08-23 NOTE — Telephone Encounter (Signed)
Informed patient of negative results per provider 

## 2016-08-23 NOTE — Telephone Encounter (Signed)
error 

## 2016-09-25 ENCOUNTER — Encounter: Payer: Self-pay | Admitting: Family Medicine

## 2016-09-25 ENCOUNTER — Ambulatory Visit (INDEPENDENT_AMBULATORY_CARE_PROVIDER_SITE_OTHER): Payer: 59 | Admitting: Family Medicine

## 2016-09-25 VITALS — BP 108/60 | HR 68 | Temp 98.2°F | Ht 62.0 in | Wt 134.2 lb

## 2016-09-25 DIAGNOSIS — I8390 Asymptomatic varicose veins of unspecified lower extremity: Secondary | ICD-10-CM

## 2016-09-25 DIAGNOSIS — N939 Abnormal uterine and vaginal bleeding, unspecified: Secondary | ICD-10-CM | POA: Diagnosis not present

## 2016-09-25 DIAGNOSIS — E782 Mixed hyperlipidemia: Secondary | ICD-10-CM | POA: Diagnosis not present

## 2016-09-25 DIAGNOSIS — Z803 Family history of malignant neoplasm of breast: Secondary | ICD-10-CM

## 2016-09-25 DIAGNOSIS — Z0000 Encounter for general adult medical examination without abnormal findings: Secondary | ICD-10-CM

## 2016-09-25 NOTE — Assessment & Plan Note (Signed)
Preventative protocols reviewed and updated unless pt declined. Discussed healthy diet and lifestyle.  

## 2016-09-25 NOTE — Progress Notes (Signed)
BP 108/60   Pulse 68   Temp 98.2 F (36.8 C) (Oral)   Ht '5\' 2"'  (1.575 m)   Wt 134 lb 4 oz (60.9 kg)   LMP 09/03/2016   BMI 24.55 kg/m    CC: CPE Subjective:    Patient ID: Ashley Murray, female    DOB: 10/30/1975, 41 y.o.   MRN: [id number]  HPI: Ashley Murray is a 41 y.o. female presenting on 09/25/2016 for Annual Exam   OCP concerns - started new birth control pill 1 month ago by OBGYN (Q50moperiod) but has had ongoing bleeding over last 3 weeks - period level bleeding without slowing down.   Varicose vein of R leg noted more prominently recently. Occasional discomfort with prolonged standing. No leg swelling, no significant pain or redness or warmth.   [facility] as well as breast. Pap normal 08/2016. Started new OCP (see above).  Normal mammogram 2015. Rpt pending. Mother with h/o breat cancer, mom BRCA gene negative  Flu shot - [facility] yearly Td - 2011  Seat belt use discussed Sunscreen use discussed. No changing moles on skin. Non smoker  Alcohol - occasional  caffeine: 1 cup coffee/day, some tea in summer  Marital Status: Married  Lives with 2 sons and husband, 1 dog, 1 cat  Children: 2-- (2008 and 2005)  Occupation: dIT sales professionalat [employer]  Husband is dMudloggerof dining services at [employer]--6/09 no longer at [employer] Activity: walks every other day  Diet: good water, daily fruits/vegetables, red meat 1-2x/wk, fish rarely   Relevant past medical, surgical, family and social history reviewed and updated as indicated. Interim medical history since our last visit reviewed. Allergies and medications reviewed and updated. Outpatient Medications Prior to Visit  Medication Sig Dispense Refill  . Levonorgestrel-Ethinyl Estradiol (AMETHIA,CAMRESE) 0.15-0.03 &0.01 MG tablet Take 1 tablet by mouth at bedtime. 84 tablet 3   No facility-administered medications prior to visit.      Per HPI  unless specifically indicated in ROS section below Review of Systems  Constitutional: Negative for activity change, appetite change, chills, fatigue, fever and unexpected weight change.  HENT: Negative for hearing loss.   Eyes: Negative for visual disturbance.  Respiratory: Negative for cough, chest tightness, shortness of breath and wheezing.   Cardiovascular: Negative for chest pain, palpitations and leg swelling.  Gastrointestinal: Negative for abdominal distention, abdominal pain, blood in stool, constipation, diarrhea, nausea and vomiting.  Genitourinary: Negative for difficulty urinating and hematuria.  Musculoskeletal: Negative for arthralgias, myalgias and neck pain.  Skin: Negative for rash.  Neurological: Negative for dizziness, seizures, syncope and headaches.  Hematological: Negative for adenopathy. Does not bruise/bleed easily.  Psychiatric/Behavioral: Negative for dysphoric mood. The patient is not nervous/anxious.        Objective:    BP 108/60   Pulse 68   Temp 98.2 F (36.8 C) (Oral)   Ht '5\' 2"'  (1.575 m)   Wt 134 lb 4 oz (60.9 kg)   LMP 09/03/2016   BMI 24.55 kg/m   Wt Readings from Last 3 Encounters:  09/25/16 134 lb 4 oz (60.9 kg)  08/17/16 129 lb 3 oz (58.6 kg)  10/09/14 125 lb 8 oz (56.9 kg)    Physical Exam  Constitutional: She is oriented to person, place, and time. She appears well-developed and well-nourished. No distress.  HENT:  Head: Normocephalic and atraumatic.  Right Ear: Hearing, tympanic membrane, external ear and ear  canal normal.  Left Ear: Hearing, tympanic membrane, external ear and ear canal normal.  Nose: Nose normal.  Mouth/Throat: Uvula is midline, oropharynx is clear and moist and mucous membranes are normal. No oropharyngeal exudate, posterior oropharyngeal edema or posterior oropharyngeal erythema.  Eyes: Conjunctivae and EOM are normal. Pupils are equal, round, and reactive to light. No scleral icterus.  Neck: Normal range of  motion. Neck supple. No thyromegaly present.  Cardiovascular: Normal rate, regular rhythm, normal heart sounds and intact distal pulses.   No murmur heard. Pulses:      Radial pulses are 2+ on the right side, and 2+ on the left side.  Pulmonary/Chest: Effort normal and breath sounds normal. No respiratory distress. She has no wheezes. She has no rales.  Abdominal: Soft. Bowel sounds are normal. She exhibits no distension and no mass. There is no tenderness. There is no rebound and no guarding.  Musculoskeletal: Normal range of motion. She exhibits no edema.  Lymphadenopathy:    She has no cervical adenopathy.  Neurological: She is alert and oriented to person, place, and time.  CN grossly intact, station and gait intact  Skin: Skin is warm and dry. No rash noted.  Psychiatric: She has a normal mood and affect. Her behavior is normal. Judgment and thought content normal.  Nursing note and vitals reviewed.  Results for orders placed or performed in visit on 08/17/16  Pap IG and HPV (high risk) DNA detection  Result Value Ref Range   DIAGNOSIS: Comment    Specimen adequacy: Comment    CLINICIAN PROVIDED ICD10: Comment    Performed by: Comment    PAP SMEAR COMMENT .    Note: Comment    Test Methodology Comment    HPV, high-risk Negative Negative      Assessment & Plan:   Problem List Items Addressed This Visit    Family history of breast cancer in mother    Mammograms through [place] maintenance - Primary    Preventative protocols reviewed and updated unless pt declined. Discussed healthy diet and lifestyle.       HYPERLIPIDEMIA    Great control last check with [facility]. Update FLP today.       Relevant Orders   Lipid panel   TSH   Basic metabolic panel   VARICOSE VEINS, LOWER EXTREMITIES    Supportive care reviewed. Rx for compression stockings provided today.        Other Visit Diagnoses    Abnormal uterine bleeding (AUB)       Relevant Orders   CBC  with Differential/Platelet       Follow up plan: Return in about 2 years (around 09/26/2018) for annual exam, prior fasting for blood work.  Ria Bush, MD

## 2016-09-25 NOTE — Progress Notes (Signed)
Pre visit review using our clinic review tool, if applicable. No additional management support is needed unless otherwise documented below in the visit note. 

## 2016-09-25 NOTE — Assessment & Plan Note (Signed)
Supportive care reviewed. Rx for compression stockings provided today.

## 2016-09-25 NOTE — Assessment & Plan Note (Addendum)
Mammograms through [facility]

## 2016-09-25 NOTE — Patient Instructions (Addendum)
Labs today.  Touch base with Dr Amalia Hailey about new birth control pill.  Rx for compression stockings provided today.  You are doing well today Return as needed or in 1-2 years for next physical.  Health Maintenance, Female Adopting a healthy lifestyle and getting preventive care can go a long way to promote health and wellness. Talk with your health care provider about what schedule of regular examinations is right for you. This is a good chance for you to check in with your provider about disease prevention and staying healthy. In between checkups, there are plenty of things you can do on your own. Experts have done a lot of research about which lifestyle changes and preventive measures are most likely to keep you healthy. Ask your health care provider for more information. Weight and diet Eat a healthy diet  Be sure to include plenty of vegetables, fruits, low-fat dairy products, and lean protein.  Do not eat a lot of foods high in solid fats, added sugars, or salt.  Get regular exercise. This is one of the most important things you can do for your health.  Most adults should exercise for at least 150 minutes each week. The exercise should increase your heart rate and make you sweat (moderate-intensity exercise).  Most adults should also do strengthening exercises at least twice a week. This is in addition to the moderate-intensity exercise. Maintain a healthy weight  Body mass index (BMI) is a measurement that can be used to identify possible weight problems. It estimates body fat based on height and weight. Your health care provider can help determine your BMI and help you achieve or maintain a healthy weight.  For females 72 years of age and older:  A BMI below 18.5 is considered underweight.  A BMI of 18.5 to 24.9 is normal.  A BMI of 25 to 29.9 is considered overweight.  A BMI of 30 and above is considered obese. Watch levels of cholesterol and blood lipids  You should start  having your blood tested for lipids and cholesterol at 43 years of age, then have this test every 5 years.  You may need to have your cholesterol levels checked more often if:  Your lipid or cholesterol levels are high.  You are older than 43 years of age.  You are at high risk for heart disease. Cancer screening Lung Cancer  Lung cancer screening is recommended for adults 59-55 years old who are at high risk for lung cancer because of a history of smoking.  A yearly low-dose CT scan of the lungs is recommended for people who:  Currently smoke.  Have quit within the past 15 years.  Have at least a 30-pack-year history of smoking. A pack year is smoking an average of one pack of cigarettes a day for 1 year.  Yearly screening should continue until it has been 15 years since you quit.  Yearly screening should stop if you develop a health problem that would prevent you from having lung cancer treatment. Breast Cancer  Practice breast self-awareness. This means understanding how your breasts normally appear and feel.  It also means doing regular breast self-exams. Let your health care provider know about any changes, no matter how small.  If you are in your 20s or 30s, you should have a clinical breast exam (CBE) by a health care provider every 1-3 years as part of a regular health exam.  If you are 48 or older, have a CBE every year. Also consider  having a breast X-ray (mammogram) every year.  If you have a family history of breast cancer, talk to your health care provider about genetic screening.  If you are at high risk for breast cancer, talk to your health care provider about having an MRI and a mammogram every year.  Breast cancer gene (BRCA) assessment is recommended for women who have family members with BRCA-related cancers. BRCA-related cancers include:  Breast.  Ovarian.  Tubal.  Peritoneal cancers.  Results of the assessment will determine the need for genetic  counseling and BRCA1 and BRCA2 testing. Cervical Cancer  Your health care provider may recommend that you be screened regularly for cancer of the pelvic organs (ovaries, uterus, and vagina). This screening involves a pelvic examination, including checking for microscopic changes to the surface of your cervix (Pap test). You may be encouraged to have this screening done every 3 years, beginning at age 16.  For women ages 98-65, health care providers may recommend pelvic exams and Pap testing every 3 years, or they may recommend the Pap and pelvic exam, combined with testing for human papilloma virus (HPV), every 5 years. Some types of HPV increase your risk of cervical cancer. Testing for HPV may also be done on women of any age with unclear Pap test results.  Other health care providers may not recommend any screening for nonpregnant women who are considered low risk for pelvic cancer and who do not have symptoms. Ask your health care provider if a screening pelvic exam is right for you.  If you have had past treatment for cervical cancer or a condition that could lead to cancer, you need Pap tests and screening for cancer for at least 20 years after your treatment. If Pap tests have been discontinued, your risk factors (such as having a new sexual partner) need to be reassessed to determine if screening should resume. Some women have medical problems that increase the chance of getting cervical cancer. In these cases, your health care provider may recommend more frequent screening and Pap tests. Colorectal Cancer  This type of cancer can be detected and often prevented.  Routine colorectal cancer screening usually begins at 43 years of age and continues through 43 years of age.  Your health care provider may recommend screening at an earlier age if you have risk factors for colon cancer.  Your health care provider may also recommend using home test kits to check for hidden blood in the stool.  A  small camera at the end of a tube can be used to examine your colon directly (sigmoidoscopy or colonoscopy). This is done to check for the earliest forms of colorectal cancer.  Routine screening usually begins at age 78.  Direct examination of the colon should be repeated every 5-10 years through 43 years of age. However, you may need to be screened more often if early forms of precancerous polyps or small growths are found. Skin Cancer  Check your skin from head to toe regularly.  Tell your health care provider about any new moles or changes in moles, especially if there is a change in a mole's shape or color.  Also tell your health care provider if you have a mole that is larger than the size of a pencil eraser.  Always use sunscreen. Apply sunscreen liberally and repeatedly throughout the day.  Protect yourself by wearing long sleeves, pants, a wide-brimmed hat, and sunglasses whenever you are outside. Heart disease, diabetes, and high blood pressure  High blood  pressure causes heart disease and increases the risk of stroke. High blood pressure is more likely to develop in:  People who have blood pressure in the high end of the normal range (130-139/85-89 mm Hg).  People who are overweight or obese.  People who are African American.  If you are 35-52 years of age, have your blood pressure checked every 3-5 years. If you are 22 years of age or older, have your blood pressure checked every year. You should have your blood pressure measured twice-once when you are at a hospital or clinic, and once when you are not at a hospital or clinic. Record the average of the two measurements. To check your blood pressure when you are not at a hospital or clinic, you can use:  An automated blood pressure machine at a pharmacy.  A home blood pressure monitor.  If you are between 16 years and 69 years old, ask your health care provider if you should take aspirin to prevent strokes.  Have regular  diabetes screenings. This involves taking a blood sample to check your fasting blood sugar level.  If you are at a normal weight and have a low risk for diabetes, have this test once every three years after 44 years of age.  If you are overweight and have a high risk for diabetes, consider being tested at a younger age or more often. Preventing infection Hepatitis B  If you have a higher risk for hepatitis B, you should be screened for this virus. You are considered at high risk for hepatitis B if:  You were born in a country where hepatitis B is common. Ask your health care provider which countries are considered high risk.  Your parents were born in a high-risk country, and you have not been immunized against hepatitis B (hepatitis B vaccine).  You have HIV or AIDS.  You use needles to inject street drugs.  You live with someone who has hepatitis B.  You have had sex with someone who has hepatitis B.  You get hemodialysis treatment.  You take certain medicines for conditions, including cancer, organ transplantation, and autoimmune conditions. Hepatitis C  Blood testing is recommended for:  Everyone born from [birth year] through 1967.  Anyone with known risk factors for hepatitis C. Sexually transmitted infections (STIs)  You should be screened for sexually transmitted infections (STIs) including gonorrhea and chlamydia if:  You are sexually active and are younger than 43 years of age.  You are older than 43 years of age and your health care provider tells you that you are at risk for this type of infection.  Your sexual activity has changed since you were last screened and you are at an increased risk for chlamydia or gonorrhea. Ask your health care provider if you are at risk.  If you do not have HIV, but are at risk, it may be recommended that you take a prescription medicine daily to prevent HIV infection. This is called pre-exposure prophylaxis (PrEP). You are considered at  risk if:  You are sexually active and do not regularly use condoms or know the HIV status of your partner(s).  You take drugs by injection.  You are sexually active with a partner who has HIV. Talk with your health care provider about whether you are at high risk of being infected with HIV. If you choose to begin PrEP, you should first be tested for HIV. You should then be tested every 3 months for as long as you  are taking PrEP. Pregnancy  If you are premenopausal and you may become pregnant, ask your health care provider about preconception counseling.  If you may become pregnant, take 400 to 800 micrograms (mcg) of folic acid every day.  If you want to prevent pregnancy, talk to your health care provider about birth control (contraception). Osteoporosis and menopause  Osteoporosis is a disease in which the bones lose minerals and strength with aging. This can result in serious bone fractures. Your risk for osteoporosis can be identified using a bone density scan.  If you are 54 years of age or older, or if you are at risk for osteoporosis and fractures, ask your health care provider if you should be screened.  Ask your health care provider whether you should take a calcium or vitamin D supplement to lower your risk for osteoporosis.  Menopause may have certain physical symptoms and risks.  Hormone replacement therapy may reduce some of these symptoms and risks. Talk to your health care provider about whether hormone replacement therapy is right for you. Follow these instructions at home:  Schedule regular health, dental, and eye exams.  Stay current with your immunizations.  Do not use any tobacco products including cigarettes, chewing tobacco, or electronic cigarettes.  If you are pregnant, do not drink alcohol.  If you are breastfeeding, limit how much and how often you drink alcohol.  Limit alcohol intake to no more than 1 drink per day for nonpregnant women. One drink  equals 12 ounces of beer, 5 ounces of wine, or 1 ounces of hard liquor.  Do not use street drugs.  Do not share needles.  Ask your health care provider for help if you need support or information about quitting drugs.  Tell your health care provider if you often feel depressed.  Tell your health care provider if you have ever been abused or do not feel safe at home. This information is not intended to replace advice given to you by your health care provider. Make sure you discuss any questions you have with your health care provider. Document Released: 01/09/2011 Document Revised: 12/02/2015 Document Reviewed: 03/30/2015 Elsevier Interactive Patient Education  2017 [employer].

## 2016-09-25 NOTE — Assessment & Plan Note (Signed)
Great control last check with [facility]. Update FLP today.

## 2016-09-26 LAB — CBC WITH DIFFERENTIAL/PLATELET
BASOS PCT: 1.2 % (ref 0.0–3.0)
Basophils Absolute: 0.1 10*3/uL (ref 0.0–0.1)
EOS ABS: 0.2 10*3/uL (ref 0.0–0.7)
EOS PCT: 2.9 % (ref 0.0–5.0)
HCT: 38.3 % (ref 36.0–46.0)
HEMOGLOBIN: 12.7 g/dL (ref 12.0–15.0)
LYMPHS PCT: 34.7 % (ref 12.0–46.0)
Lymphs Abs: 2.6 10*3/uL (ref 0.7–4.0)
MCHC: 33.3 g/dL (ref 30.0–36.0)
MCV: 91.6 fl (ref 78.0–100.0)
MONO ABS: 0.6 10*3/uL (ref 0.1–1.0)
Monocytes Relative: 8.4 % (ref 3.0–12.0)
Neutro Abs: 4 10*3/uL (ref 1.4–7.7)
Neutrophils Relative %: 52.8 % (ref 43.0–77.0)
Platelets: 259 10*3/uL (ref 150.0–400.0)
RBC: 4.18 Mil/uL (ref 3.87–5.11)
RDW: 12.5 % (ref 11.5–15.5)
WBC: 7.5 10*3/uL (ref 4.0–10.5)

## 2016-09-26 LAB — LIPID PANEL
CHOL/HDL RATIO: 3
CHOLESTEROL: 189 mg/dL (ref 0–200)
HDL: 54.6 mg/dL (ref >39.00)
LDL CALC: 118 mg/dL — AB (ref 0–99)
NonHDL: 134.31
TRIGLYCERIDES: 80 mg/dL (ref 0.0–149.0)
VLDL: 16 mg/dL (ref 0.0–40.0)

## 2016-09-26 LAB — BASIC METABOLIC PANEL
BUN: 17 mg/dL (ref 6–23)
CO2: 26 meq/L (ref 19–32)
Calcium: 9.4 mg/dL (ref 8.4–10.5)
Chloride: 104 mEq/L (ref 96–112)
Creatinine, Ser: 0.88 mg/dL (ref 0.40–1.20)
GFR: 74.58 mL/min (ref >60.00)
GLUCOSE: 82 mg/dL (ref 70–99)
POTASSIUM: 4.2 meq/L (ref 3.5–5.1)
SODIUM: 137 meq/L (ref 135–145)

## 2016-09-26 LAB — TSH: TSH: 1.25 u[IU]/mL (ref 0.35–4.50)

## 2017-08-20 ENCOUNTER — Encounter: Payer: Self-pay | Admitting: Obstetrics and Gynecology

## 2017-08-29 ENCOUNTER — Encounter: Payer: Self-pay | Admitting: Family Medicine

## 2017-09-05 ENCOUNTER — Encounter: Payer: Self-pay | Admitting: Obstetrics and Gynecology

## 2017-09-05 ENCOUNTER — Ambulatory Visit (INDEPENDENT_AMBULATORY_CARE_PROVIDER_SITE_OTHER): Payer: 59 | Admitting: Obstetrics and Gynecology

## 2017-09-05 VITALS — BP 107/64 | HR 71 | Ht 62.0 in | Wt 132.9 lb

## 2017-09-05 DIAGNOSIS — Z01419 Encounter for gynecological examination (general) (routine) without abnormal findings: Secondary | ICD-10-CM | POA: Diagnosis not present

## 2017-09-05 NOTE — Progress Notes (Signed)
HPI:      [name] is a 42 y.o. [id number] who LMP was Patient's last menstrual period was 08/20/2017.  Subjective:   She presents today for her annual examination.  At her last visit she decided to try OCPs.  She tried them and had more bleeding than she liked and discontinued them.  She states her periods are now monthly and approximately 3 days long.  She does not want to take OCPs at this time.  She states that she is using "protection"for birth control. She is followed by her family physician for slightly elevated lipids and cholesterol. She had a mammogram last year at [facility] and she reports that it was normal.  She will continue to get yearly mammograms but would like to schedule them on her own.  (I do not have to order them).    Hx: The following portions of the patient's history were reviewed and updated as appropriate:             She  has a past medical history of Depression, HLD (hyperlipidemia), and Tension headache. She does not have any pertinent problems on file. She  has a past surgical history that includes Wisdom tooth extraction and Other surgical history. Her family history includes Cancer (age of onset: 72) in her mother; Cancer (age of onset: 36) in her cousin and paternal aunt; Coronary artery disease in her paternal grandfather; Hyperlipidemia in her father; Kidney disease (age of onset: 1) in her father. She  reports that  has never smoked. she has never used smokeless tobacco. She reports that she drinks alcohol. She reports that she does not use drugs. She has a current medication list which includes the following prescription(s): levonorgestrel-ethinyl estradiol. She has No Known Allergies.       Review of Systems:  Review of Systems  Constitutional: Denied constitutional symptoms, night sweats, recent illness, fatigue, fever, insomnia and weight loss.  Eyes: Denied eye symptoms, eye pain, photophobia, vision change and visual disturbance.   Ears/Nose/Throat/Neck: Denied ear, nose, throat or neck symptoms, hearing loss, nasal discharge, sinus congestion and sore throat.  Cardiovascular: Denied cardiovascular symptoms, arrhythmia, chest pain/pressure, edema, exercise intolerance, orthopnea and palpitations.  Respiratory: Denied pulmonary symptoms, asthma, pleuritic pain, productive sputum, cough, dyspnea and wheezing.  Gastrointestinal: Denied, gastro-esophageal reflux, melena, nausea and vomiting.  Genitourinary: Denied genitourinary symptoms including symptomatic vaginal discharge, pelvic relaxation issues, and urinary complaints.  Musculoskeletal: Denied musculoskeletal symptoms, stiffness, swelling, muscle weakness and myalgia.  Dermatologic: Denied dermatology symptoms, rash and scar.  Neurologic: Denied neurology symptoms, dizziness, headache, neck pain and syncope.  Psychiatric: Denied psychiatric symptoms, anxiety and depression.  Endocrine: Denied endocrine symptoms including hot flashes and night sweats.   Meds:   Current Outpatient Medications on File Prior to Visit  Medication Sig Dispense Refill  . Levonorgestrel-Ethinyl Estradiol (AMETHIA,CAMRESE) 0.15-0.03 &0.01 MG tablet Take 1 tablet by mouth at bedtime. (Patient not taking: Reported on 09/05/2017) 84 tablet 3   No current facility-administered medications on file prior to visit.     Objective:     Vitals:   09/05/17 0810  BP: 107/64  Pulse: 71              Physical examination General NAD, Conversant  HEENT Atraumatic; Op clear with mmm.  Normo-cephalic. Pupils reactive. Anicteric sclerae  Thyroid/Neck Smooth without nodularity or enlargement. Normal ROM.  Neck Supple.  Skin No rashes, lesions or ulceration. Normal palpated skin turgor. No nodularity.  Breasts: No masses or discharge.  Symmetric.  No axillary adenopathy.  Lungs: Clear to auscultation.No rales or wheezes. Normal Respiratory effort, no retractions.  Heart: NSR.  No murmurs or rubs  appreciated. No periferal edema  Abdomen: Soft.  Non-tender.  No masses.  No HSM. No hernia  Extremities: Moves all appropriately.  Normal ROM for age. No lymphadenopathy.  Neuro: Oriented to PPT.  Normal mood. Normal affect.     Pelvic:   Vulva: Normal appearance.  No lesions.  Vagina: No lesions or abnormalities noted.  Support: Normal pelvic support.  Urethra No masses tenderness or scarring.  Meatus Normal size without lesions or prolapse.  Cervix: Normal appearance.  No lesions.  Anus: Normal exam.  No lesions.  Perineum: Normal exam.  No lesions.        Bimanual   Uterus: Normal size.  Non-tender.  Mobile.  AV.  Slightly deviated to the left side.  Adnexae: No masses.  Non-tender to palpation.  Cul-de-sac: Negative for abnormality.      Assessment:    [id number] Patient Active Problem List   Diagnosis Date Noted  . Family history of kidney cancer 10/07/2013  . Healthcare maintenance 01/19/2012  . Family history of breast cancer in mother 01/19/2012  . VARICOSE VEINS, LOWER EXTREMITIES 12/23/2008  . ALLERGIC RHINITIS 07/29/2008  . HEADACHE, TENSION 02/12/2008  . GAD (generalized anxiety disorder) 09/12/2007  . SCOLIOSIS, MILD 09/03/2007  . HYPERLIPIDEMIA 08/08/2007     1. Well woman exam     Normal exam because patient has had several years of normal Pap smears and negative HPV  We discussed the possibility of skipping Pap smears so that she has Paps every 3 years.   Plan:            1.  Basic Screening Recommendations The basic screening recommendations for asymptomatic women were discussed with the patient during her visit.  The age-appropriate recommendations were discussed with her and the rational for the tests reviewed.  When I am informed by the patient that another primary care physician has previously obtained the age-appropriate tests and they are up-to-date, only outstanding tests are ordered and referrals given as necessary.  Abnormal results of tests  will be discussed with her when all of her results are completed. No Pap done this year patient needs Pap in 2 years. Patient will order her a mammogram. Followed by family physician for cholesterol and lipids. Orders No orders of the defined types were placed in this encounter.   No orders of the defined types were placed in this encounter.       F/U  Return in about 1 year (around 09/05/2018) for Annual Physical.  Finis Bud, M.D. 09/05/2017 8:37 AM

## 2017-10-08 ENCOUNTER — Encounter: Payer: 59 | Admitting: Family Medicine

## 2017-10-08 DIAGNOSIS — Z0289 Encounter for other administrative examinations: Secondary | ICD-10-CM

## 2018-05-19 ENCOUNTER — Encounter: Payer: Self-pay | Admitting: Family Medicine

## 2018-05-19 NOTE — Progress Notes (Deleted)
There were no vitals taken for this visit.   CC: CPE Subjective:    Patient ID: Ashley Murray, female    DOB: [birth year], 42 y.o.   MRN: [id number]  HPI: Ashley Murray is a 42 y.o. female presenting on 05/20/2018 for No chief complaint on file.   Preventative: Well woman exam - [facility] Dr Baldemar Friday, paps there as well as breast. Pap normal 08/2016. Started new OCP (see above).  Normal mammogram 2015. Rpt pending. Mother with h/o breat cancer, mom BRCA gene negative  Flu shot - [facility] yearly Td - 2011  Seat belt use discussed Sunscreen use discussed. No changing moles on skin. Non smoker  Alcohol - occasional  caffeine: 1 cup coffee/day, some tea in summer  Marital Status: Married  Lives with 2 sons and husband, 1 dog, 1 cat  Children: 2-- (2008 and 2005)  Occupation: IT sales professional at [employer].  Husband is Mudlogger of dining services at [employer]--6/09 no longer at [employer]  Activity: walks every other day  Diet: good water, daily fruits/vegetables, red meat 1-2x/wk, fish rarely   Relevant past medical, surgical, family and social history reviewed and updated as indicated. Interim medical history since our last visit reviewed. Allergies and medications reviewed and updated. Outpatient Medications Prior to Visit  Medication Sig Dispense Refill  . Levonorgestrel-Ethinyl Estradiol (AMETHIA,CAMRESE) 0.15-0.03 &0.01 MG tablet Take 1 tablet by mouth at bedtime. (Patient not taking: Reported on 09/05/2017) 84 tablet 3   No facility-administered medications prior to visit.      Per HPI unless specifically indicated in ROS section below Review of Systems  Constitutional: Negative for activity change, appetite change, chills, fatigue, fever and unexpected weight change.  HENT: Negative for hearing loss.   Eyes: Negative for visual disturbance.  Respiratory: Negative for cough, chest tightness, shortness of breath and wheezing.   Cardiovascular: Negative for  chest pain, palpitations and leg swelling.  Gastrointestinal: Negative for abdominal distention, abdominal pain, blood in stool, constipation, diarrhea, nausea and vomiting.  Genitourinary: Negative for difficulty urinating and hematuria.  Musculoskeletal: Negative for arthralgias, myalgias and neck pain.  Skin: Negative for rash.  Neurological: Negative for dizziness, seizures, syncope and headaches.  Hematological: Negative for adenopathy. Does not bruise/bleed easily.  Psychiatric/Behavioral: Negative for dysphoric mood. The patient is not nervous/anxious.        Objective:    There were no vitals taken for this visit.  Wt Readings from Last 3 Encounters:  09/05/17 132 lb 14.4 oz (60.3 kg)  09/25/16 134 lb 4 oz (60.9 kg)  08/17/16 129 lb 3 oz (58.6 kg)    Physical Exam  Constitutional: She is oriented to person, place, and time. She appears well-developed and well-nourished. No distress.  HENT:  Head: Normocephalic and atraumatic.  Right Ear: Hearing, tympanic membrane, external ear and ear canal normal.  Left Ear: Hearing, tympanic membrane, external ear and ear canal normal.  Nose: Nose normal.  Mouth/Throat: Uvula is midline, oropharynx is clear and moist and mucous membranes are normal. No oropharyngeal exudate, posterior oropharyngeal edema or posterior oropharyngeal erythema.  Eyes: Pupils are equal, round, and reactive to light. Conjunctivae and EOM are normal. No scleral icterus.  Neck: Normal range of motion. Neck supple.  Cardiovascular: Normal rate, regular rhythm, normal heart sounds and intact distal pulses.  No murmur heard. Pulses:      Radial pulses are 2+ on the right side, and 2+ on the left side.  Pulmonary/Chest: Effort normal and breath sounds normal.  No respiratory distress. She has no wheezes. She has no rales.  Abdominal: Soft. Bowel sounds are normal. She exhibits no distension and no mass. There is no tenderness. There is no rebound and no guarding.    Musculoskeletal: Normal range of motion. She exhibits no edema.  Lymphadenopathy:    She has no cervical adenopathy.  Neurological: She is alert and oriented to person, place, and time.  CN grossly intact, station and gait intact  Skin: Skin is warm and dry. No rash noted.  Psychiatric: She has a normal mood and affect. Her behavior is normal. Judgment and thought content normal.  Nursing note and vitals reviewed.  Results for orders placed or performed in visit on 09/25/16  Lipid panel  Result Value Ref Range   Cholesterol 189 0 - 200 mg/dL   Triglycerides 80.0 0.0 - 149.0 mg/dL   HDL 54.60 >39.00 mg/dL   VLDL 16.0 0.0 - 40.0 mg/dL   LDL Cholesterol 118 (H) 0 - 99 mg/dL   Total CHOL/HDL Ratio 3    NonHDL 134.31   TSH  Result Value Ref Range   TSH 1.25 0.35 - 4.50 uIU/mL  CBC with Differential/Platelet  Result Value Ref Range   WBC 7.5 4.0 - 10.5 K/uL   RBC 4.18 3.87 - 5.11 Mil/uL   Hemoglobin 12.7 12.0 - 15.0 g/dL   HCT 38.3 36.0 - 46.0 %   MCV 91.6 78.0 - 100.0 fl   MCHC 33.3 30.0 - 36.0 g/dL   RDW 12.5 11.5 - 15.5 %   Platelets 259.0 150.0 - 400.0 K/uL   Neutrophils Relative % 52.8 43.0 - 77.0 %   Lymphocytes Relative 34.7 12.0 - 46.0 %   Monocytes Relative 8.4 3.0 - 12.0 %   Eosinophils Relative 2.9 0.0 - 5.0 %   Basophils Relative 1.2 0.0 - 3.0 %   Neutro Abs 4.0 1.4 - 7.7 K/uL   Lymphs Abs 2.6 0.7 - 4.0 K/uL   Monocytes Absolute 0.6 0.1 - 1.0 K/uL   Eosinophils Absolute 0.2 0.0 - 0.7 K/uL   Basophils Absolute 0.1 0.0 - 0.1 K/uL  Basic metabolic panel  Result Value Ref Range   Sodium 137 135 - 145 mEq/L   Potassium 4.2 3.5 - 5.1 mEq/L   Chloride 104 96 - 112 mEq/L   CO2 26 19 - 32 mEq/L   Glucose, Bld 82 70 - 99 mg/dL   BUN 17 6 - 23 mg/dL   Creatinine, Ser 0.88 0.40 - 1.20 mg/dL   Calcium 9.4 8.4 - 10.5 mg/dL   GFR 74.58 >60.00 mL/min      Assessment & Plan:   Problem List Items Addressed This Visit    None       No orders of the defined types  were placed in this encounter.  No orders of the defined types were placed in this encounter.   Follow up plan: No follow-ups on file.  Ria Bush, MD

## 2018-05-19 NOTE — Assessment & Plan Note (Deleted)
Preventative protocols reviewed and updated unless pt declined. Discussed healthy diet and lifestyle.  

## 2018-05-20 ENCOUNTER — Encounter: Payer: 59 | Admitting: Family Medicine

## 2018-05-20 DIAGNOSIS — Z0289 Encounter for other administrative examinations: Secondary | ICD-10-CM

## 2018-09-06 ENCOUNTER — Encounter: Payer: Self-pay | Admitting: Obstetrics and Gynecology

## 2018-09-06 ENCOUNTER — Ambulatory Visit (INDEPENDENT_AMBULATORY_CARE_PROVIDER_SITE_OTHER): Payer: 59 | Admitting: Obstetrics and Gynecology

## 2018-09-06 VITALS — BP 117/70 | HR 79 | Ht 62.0 in | Wt 137.1 lb

## 2018-09-06 DIAGNOSIS — Z01419 Encounter for gynecological examination (general) (routine) without abnormal findings: Secondary | ICD-10-CM | POA: Diagnosis not present

## 2018-09-06 NOTE — Progress Notes (Signed)
HPI:      Ashley Murray is a 43 y.o. [id number] who LMP was Patient's last menstrual period was 08/29/2018.  Subjective:   She presents today for her annual examination.  She continues to have monthly regular cycles.  She uses condoms for birth control but is interested in an IUD.  She says she has been "reading about it".    Hx: The following portions of the patient's history were reviewed and updated as appropriate:             She  has a past medical history of Depression, HLD (hyperlipidemia), and Tension headache. She does not have any pertinent problems on file. She  has a past surgical history that includes Wisdom tooth extraction and Other surgical history. Her family history includes Cancer (age of onset: 65) in her mother; Cancer (age of onset: 32) in her cousin and paternal aunt; Coronary artery disease in her paternal grandfather; Hyperlipidemia in her father; Kidney disease (age of onset: 42) in her father. She  reports that she has never smoked. She has never used smokeless tobacco. She reports current alcohol use. She reports that she does not use drugs. She currently has no medications in their medication list. She has No Known Allergies.       Review of Systems:  Review of Systems  Constitutional: Denied constitutional symptoms, night sweats, recent illness, fatigue, fever, insomnia and weight loss.  Eyes: Denied eye symptoms, eye pain, photophobia, vision change and visual disturbance.  Ears/Nose/Throat/Neck: Denied ear, nose, throat or neck symptoms, hearing loss, nasal discharge, sinus congestion and sore throat.  Cardiovascular: Denied cardiovascular symptoms, arrhythmia, chest pain/pressure, edema, exercise intolerance, orthopnea and palpitations.  Respiratory: Denied pulmonary symptoms, asthma, pleuritic pain, productive sputum, cough, dyspnea and wheezing.  Gastrointestinal: Denied, gastro-esophageal reflux, melena, nausea and vomiting.  Genitourinary: Denied  genitourinary symptoms including symptomatic vaginal discharge, pelvic relaxation issues, and urinary complaints.  Musculoskeletal: Denied musculoskeletal symptoms, stiffness, swelling, muscle weakness and myalgia.  Dermatologic: Denied dermatology symptoms, rash and scar.  Neurologic: Denied neurology symptoms, dizziness, headache, neck pain and syncope.  Psychiatric: Denied psychiatric symptoms, anxiety and depression.  Endocrine: Denied endocrine symptoms including hot flashes and night sweats.   Meds:   No current outpatient medications on file prior to visit.   No current facility-administered medications on file prior to visit.     Objective:     Vitals:   09/06/18 0812  BP: 117/70  Pulse: 79              Physical examination General NAD, Conversant  HEENT Atraumatic; Op clear with mmm.  Normo-cephalic. Pupils reactive. Anicteric sclerae  Thyroid/Neck Smooth without nodularity or enlargement. Normal ROM.  Neck Supple.  Skin No rashes, lesions or ulceration. Normal palpated skin turgor. No nodularity.  Breasts: No masses or discharge.  Symmetric.  No axillary adenopathy.  Lungs: Clear to auscultation.No rales or wheezes. Normal Respiratory effort, no retractions.  Heart: NSR.  No murmurs or rubs appreciated. No periferal edema  Abdomen: Soft.  Non-tender.  No masses.  No HSM. No hernia  Extremities: Moves all appropriately.  Normal ROM for age. No lymphadenopathy.  Neuro: Oriented to PPT.  Normal mood. Normal affect.     Pelvic:   Vulva: Normal appearance.  No lesions.  Vagina: No lesions or abnormalities noted.  Support: Normal pelvic support.  Urethra No masses tenderness or scarring.  Meatus Normal size without lesions or prolapse.  Cervix: Normal appearance.  No lesions.  Anus: Normal  exam.  No lesions.  Perineum: Normal exam.  No lesions.        Bimanual   Uterus: Normal size.  Non-tender.  Mobile.  AV.  Adnexae: No masses.  Non-tender to palpation.   Cul-de-sac: Negative for abnormality.      Assessment:    [id number] Patient Active Problem List   Diagnosis Date Noted  . Family history of kidney cancer 10/07/2013  . Healthcare maintenance 01/19/2012  . Family history of breast cancer in mother 01/19/2012  . VARICOSE VEINS, LOWER EXTREMITIES 12/23/2008  . ALLERGIC RHINITIS 07/29/2008  . HEADACHE, TENSION 02/12/2008  . GAD (generalized anxiety disorder) 09/12/2007  . SCOLIOSIS, MILD 09/03/2007  . HYPERLIPIDEMIA 08/08/2007     1. Well woman exam     Normal exam  Family history of breast cancer in her mother.  Patient gets annual examinations and mammograms.   Plan:            1.  Basic Screening Recommendations The basic screening recommendations for asymptomatic women were discussed with the patient during her visit.  The age-appropriate recommendations were discussed with her and the rational for the tests reviewed.  When I am informed by the patient that another primary care physician has previously obtained the age-appropriate tests and they are up-to-date, only outstanding tests are ordered and referrals given as necessary.  Abnormal results of tests will be discussed with her when all of her results are completed. Patient orders her own mammogram which is due in October.  2.  IUD Literature on Mirena given.  Risks and benefits discussed.  She is considering IUD as an option for birth/cycle control.  Orders Orders Placed This Encounter  Procedures  . MM 3D SCREEN BREAST BILATERAL  . Glucose, random  . Lipid panel  . TSH    No orders of the defined types were placed in this encounter.       F/U  Return for She is to call at the start of next menses.  Finis Bud, M.D. 09/06/2018 8:33 AM

## 2018-09-06 NOTE — Patient Instructions (Signed)
Preventive Care 40-64 Years, Female Preventive care refers to lifestyle choices and visits with your health care provider that can promote health and wellness. What does preventive care include?   A yearly physical exam. This is also called an annual well check.  Dental exams once or twice a year.  Routine eye exams. Ask your health care provider how often you should have your eyes checked.  Personal lifestyle choices, including: ? Daily care of your teeth and gums. ? Regular physical activity. ? Eating a healthy diet. ? Avoiding tobacco and drug use. ? Limiting alcohol use. ? Practicing safe sex. ? Taking low-dose aspirin daily starting at age 45. ? Taking vitamin and mineral supplements as recommended by your health care provider. What happens during an annual well check? The services and screenings done by your health care provider during your annual well check will depend on your age, overall health, lifestyle risk factors, and family history of disease. Counseling Your health care provider may ask you questions about your:  Alcohol use.  Tobacco use.  Drug use.  Emotional well-being.  Home and relationship well-being.  Sexual activity.  Eating habits.  Work and work environment.  Method of birth control.  Menstrual cycle.  Pregnancy history. Screening You may have the following tests or measurements:  Height, weight, and BMI.  Blood pressure.  Lipid and cholesterol levels. These may be checked every 5 years, or more frequently if you are over 45 years old.  Skin check.  Lung cancer screening. You may have this screening every year starting at age 45 if you have a 30-pack-year history of smoking and currently smoke or have quit within the past 15 years.  Colorectal cancer screening. All adults should have this screening starting at age 45 and continuing until age 75. Your health care provider may recommend screening at age 45. You will have tests every  1-10 years, depending on your results and the type of screening test. People at increased risk should start screening at an earlier age. Screening tests may include: ? Guaiac-based fecal occult blood testing. ? Fecal immunochemical test (FIT). ? Stool DNA test. ? Virtual colonoscopy. ? Sigmoidoscopy. During this test, a flexible tube with a tiny camera (sigmoidoscope) is used to examine your rectum and lower colon. The sigmoidoscope is inserted through your anus into your rectum and lower colon. ? Colonoscopy. During this test, a long, thin, flexible tube with a tiny camera (colonoscope) is used to examine your entire colon and rectum.  Hepatitis C blood test.  Hepatitis B blood test.  Sexually transmitted disease (STD) testing.  Diabetes screening. This is done by checking your blood sugar (glucose) after you have not eaten for a while (fasting). You may have this done every 1-3 years.  Mammogram. This may be done every 1-2 years. Talk to your health care provider about when you should start having regular mammograms. This may depend on whether you have a family history of breast cancer.  BRCA-related cancer screening. This may be done if you have a family history of breast, ovarian, tubal, or peritoneal cancers.  Pelvic exam and Pap test. This may be done every 3 years starting at age 21. Starting at age 30, this may be done every 5 years if you have a Pap test in combination with an HPV test.  Bone density scan. This is done to screen for osteoporosis. You may have this scan if you are at high risk for osteoporosis. Discuss your test results, treatment options,   and if necessary, the need for more tests with your health care provider. Vaccines Your health care provider may recommend certain vaccines, such as:  Influenza vaccine. This is recommended every year.  Tetanus, diphtheria, and acellular pertussis (Tdap, Td) vaccine. You may need a Td booster every 10 years.  Varicella  vaccine. You may need this if you have not been vaccinated.  Zoster vaccine. You may need this after age 60.  Measles, mumps, and rubella (MMR) vaccine. You may need at least one dose of MMR if you were born in 1957 or later. You may also need a second dose.  Pneumococcal 13-valent conjugate (PCV13) vaccine. You may need this if you have certain conditions and were not previously vaccinated.  Pneumococcal polysaccharide (PPSV23) vaccine. You may need one or two doses if you smoke cigarettes or if you have certain conditions.  Meningococcal vaccine. You may need this if you have certain conditions.  Hepatitis A vaccine. You may need this if you have certain conditions or if you travel or work in places where you may be exposed to hepatitis A.  Hepatitis B vaccine. You may need this if you have certain conditions or if you travel or work in places where you may be exposed to hepatitis B.  Haemophilus influenzae type b (Hib) vaccine. You may need this if you have certain conditions. Talk to your health care provider about which screenings and vaccines you need and how often you need them. This information is not intended to replace advice given to you by your health care provider. Make sure you discuss any questions you have with your health care provider. Document Released: 07/23/2015 Document Revised: 08/16/2017 Document Reviewed: 04/27/2015 Elsevier Interactive Patient Education  2019 Elsevier Inc. Breast Self-Awareness Breast self-awareness means:  Knowing how your breasts look.  Knowing how your breasts feel.  Checking your breasts every month for changes.  Telling your doctor if you notice a change in your breasts. Breast self-awareness allows you to notice a breast problem early while it is still small. How to do a breast self-exam One way to learn what is normal for your breasts and to check for changes is to do a breast self-exam. To do a breast self-exam: Look for  Changes  1. Take off all the clothes above your waist. 2. Stand in front of a mirror in a room with good lighting. 3. Put your hands on your hips. 4. Push your hands down. 5. Look at your breasts and nipples in the mirror to see if one breast or nipple looks different than the other. Check to see if: ? The shape of one breast is different. ? The size of one breast is different. ? There are wrinkles, dips, and bumps in one breast and not the other. 6. Look at each breast for changes in your skin, such as: ? Redness. ? Scaly areas. 7. Look for changes in your nipples, such as: ? Liquid around the nipples. ? Bleeding. ? Dimpling. ? Redness. ? A change in where the nipples are. Feel for Changes 1. Lie on your back on the floor. 2. Feel each breast. To do this, follow these steps: ? Pick a breast to feel. ? Put the arm closest to that breast above your head. ? Use your other arm to feel the nipple area of your breast. Feel the area with the pads of your three middle fingers by making small circles with your fingers. For the first circle, press lightly. For the second   circle, press harder. For the third circle, press even harder. ? Keep making circles with your fingers at the light, harder, and even harder pressures as you move down your breast. Stop when you feel your ribs. ? Move your fingers a little toward the center of your body. ? Start making circles with your fingers again, this time going up until you reach your collarbone. ? Keep making up and down circles until you reach your armpit. Remember to keep using the three pressures. ? Feel the other breast in the same way. 3. Sit or stand in the shower or tub. 4. With soapy water on your skin, feel each breast the same way you did in step 2, when you were lying on the floor. Write Down What You Find After doing the self-exam, write down:  What is normal for each breast.  Any changes you find in each breast.  When you last had  your period.  How often should I check my breasts? Check your breasts every month. If you are breastfeeding, the best time to check them is after you feed your baby or after you use a breast pump. If you get periods, the best time to check your breasts is 5-7 days after your period is over. When should I see my doctor? See your doctor if you notice:  A change in shape or size of your breasts or nipples.  A change in the skin of your breast or nipples, such as red or scaly skin.  Unusual fluid coming from your nipples.  A lump or thick area that was not there before.  Pain in your breasts.  Anything that concerns you. This information is not intended to replace advice given to you by your health care provider. Make sure you discuss any questions you have with your health care provider. Document Released: 12/13/2007 Document Revised: 12/02/2015 Document Reviewed: 05/16/2015 Elsevier Interactive Patient Education  2019 Elsevier Inc.  

## 2018-09-06 NOTE — Progress Notes (Signed)
PT is present today for her annual exam. Pt stated that she has been doing self-breast exams monthly. Pt stated that she is doing well and denies any issues. Pt would like to discuss birth control options like IUD.  No problems or concerns.

## 2018-09-12 ENCOUNTER — Other Ambulatory Visit: Payer: Self-pay | Admitting: Family Medicine

## 2018-09-12 DIAGNOSIS — E782 Mixed hyperlipidemia: Secondary | ICD-10-CM

## 2018-09-13 ENCOUNTER — Other Ambulatory Visit: Payer: 59

## 2018-09-17 ENCOUNTER — Encounter: Payer: 59 | Admitting: Family Medicine

## 2018-09-17 ENCOUNTER — Encounter: Payer: Self-pay | Admitting: Family Medicine

## 2018-09-17 NOTE — Progress Notes (Deleted)
LMP 08/29/2018    CC: CPE Subjective:    Patient ID: Ashley Murray, female    DOB: [birth year], 43 y.o.   MRN: [id number]  HPI: Ashley Murray is a 43 y.o. female presenting on 09/17/2018 for No chief complaint on file.    Preventative: well woman exam - [facility] Dr Baldemar Friday, paps there as well as breast. Pap normal 08/2016. Considering IUD.  Normal yearly mammogram given [facility] (in the fall). Mother with h/o breast cancer, mom BRCA gene negative  Flu shot - [facility] yearly Td - 2011  Seat belt use discussed Sunscreen use discussed. No changing moles on skin. Non smoker  Alcohol - occasional  caffeine: 1 cup coffee/day, some tea in summer  Marital Status: Married  Lives with 2 sons and husband, 1 dog, 1 cat  Children: 2-- (2008 and 2005)  Occupation: IT sales professional at [employer].  Husband is Mudlogger of dining services at [employer]--6/09 no longer at [employer]  Activity: walks every other day  Diet: good water, daily fruits/vegetables, red meat 1-2x/wk, fish rarely      Relevant past medical, surgical, family and social history reviewed and updated as indicated. Interim medical history since our last visit reviewed. Allergies and medications reviewed and updated. No outpatient medications prior to visit.   No facility-administered medications prior to visit.      Per HPI unless specifically indicated in ROS section below Review of Systems  Constitutional: Negative for activity change, appetite change, chills, fatigue, fever and unexpected weight change.  HENT: Negative for hearing loss.   Eyes: Negative for visual disturbance.  Respiratory: Negative for cough, chest tightness, shortness of breath and wheezing.   Cardiovascular: Negative for chest pain, palpitations and leg swelling.  Gastrointestinal: Negative for abdominal distention, abdominal pain, blood in stool, constipation, diarrhea, nausea and vomiting.  Genitourinary: Negative for difficulty  urinating and hematuria.  Musculoskeletal: Negative for arthralgias, myalgias and neck pain.  Skin: Negative for rash.  Neurological: Negative for dizziness, seizures, syncope and headaches.  Hematological: Negative for adenopathy. Does not bruise/bleed easily.  Psychiatric/Behavioral: Negative for dysphoric mood. The patient is not nervous/anxious.    Objective:    LMP 08/29/2018   Wt Readings from Last 3 Encounters:  09/06/18 137 lb 1.6 oz (62.2 kg)  09/05/17 132 lb 14.4 oz (60.3 kg)  09/25/16 134 lb 4 oz (60.9 kg)    Physical Exam Vitals signs and nursing note reviewed.  Constitutional:      General: She is not in acute distress.    Appearance: Normal appearance. She is well-developed. She is not ill-appearing.  HENT:     Head: Normocephalic and atraumatic.     Right Ear: Hearing, tympanic membrane, ear canal and external ear normal.     Left Ear: Hearing, tympanic membrane, ear canal and external ear normal.     Nose: Nose normal.     Mouth/Throat:     Mouth: Mucous membranes are moist.     Pharynx: Uvula midline. No oropharyngeal exudate or posterior oropharyngeal erythema.  Eyes:     General: No scleral icterus.    Conjunctiva/sclera: Conjunctivae normal.     Pupils: Pupils are equal, round, and reactive to light.  Neck:     Musculoskeletal: Normal range of motion and neck supple.  Cardiovascular:     Rate and Rhythm: Normal rate and regular rhythm.     Pulses: Normal pulses.          Radial pulses are 2+ on the  right side and 2+ on the left side.     Heart sounds: Normal heart sounds. No murmur.  Pulmonary:     Effort: Pulmonary effort is normal. No respiratory distress.     Breath sounds: Normal breath sounds. No wheezing, rhonchi or rales.  Abdominal:     General: Bowel sounds are normal. There is no distension.     Palpations: Abdomen is soft. There is no mass.     Tenderness: There is no abdominal tenderness. There is no guarding or rebound.  Musculoskeletal:  Normal range of motion.  Lymphadenopathy:     Cervical: No cervical adenopathy.  Skin:    General: Skin is warm and dry.     Findings: No rash.  Neurological:     Mental Status: She is alert and oriented to person, place, and time.     Comments: CN grossly intact, station and gait intact  Psychiatric:        Mood and Affect: Mood normal.        Behavior: Behavior normal.        Thought Content: Thought content normal.        Judgment: Judgment normal.       Results for orders placed or performed in visit on 09/25/16  Lipid panel  Result Value Ref Range   Cholesterol 189 0 - 200 mg/dL   Triglycerides 80.0 0.0 - 149.0 mg/dL   HDL 54.60 >39.00 mg/dL   VLDL 16.0 0.0 - 40.0 mg/dL   LDL Cholesterol 118 (H) 0 - 99 mg/dL   Total CHOL/HDL Ratio 3    NonHDL 134.31   TSH  Result Value Ref Range   TSH 1.25 0.35 - 4.50 uIU/mL  CBC with Differential/Platelet  Result Value Ref Range   WBC 7.5 4.0 - 10.5 K/uL   RBC 4.18 3.87 - 5.11 Mil/uL   Hemoglobin 12.7 12.0 - 15.0 g/dL   HCT 38.3 36.0 - 46.0 %   MCV 91.6 78.0 - 100.0 fl   MCHC 33.3 30.0 - 36.0 g/dL   RDW 12.5 11.5 - 15.5 %   Platelets 259.0 150.0 - 400.0 K/uL   Neutrophils Relative % 52.8 43.0 - 77.0 %   Lymphocytes Relative 34.7 12.0 - 46.0 %   Monocytes Relative 8.4 3.0 - 12.0 %   Eosinophils Relative 2.9 0.0 - 5.0 %   Basophils Relative 1.2 0.0 - 3.0 %   Neutro Abs 4.0 1.4 - 7.7 K/uL   Lymphs Abs 2.6 0.7 - 4.0 K/uL   Monocytes Absolute 0.6 0.1 - 1.0 K/uL   Eosinophils Absolute 0.2 0.0 - 0.7 K/uL   Basophils Absolute 0.1 0.0 - 0.1 K/uL  Basic metabolic panel  Result Value Ref Range   Sodium 137 135 - 145 mEq/L   Potassium 4.2 3.5 - 5.1 mEq/L   Chloride 104 96 - 112 mEq/L   CO2 26 19 - 32 mEq/L   Glucose, Bld 82 70 - 99 mg/dL   BUN 17 6 - 23 mg/dL   Creatinine, Ser 0.88 0.40 - 1.20 mg/dL   Calcium 9.4 8.4 - 10.5 mg/dL   GFR 74.58 >60.00 mL/min   Assessment & Plan:   Problem List Items Addressed This Visit    None        No orders of the defined types were placed in this encounter.  No orders of the defined types were placed in this encounter.   Follow up plan: No follow-ups on file.  Ria Bush, MD

## 2018-11-22 ENCOUNTER — Encounter: Payer: 59 | Admitting: Family Medicine

## 2018-12-25 ENCOUNTER — Encounter: Payer: Self-pay | Admitting: Obstetrics and Gynecology

## 2018-12-25 ENCOUNTER — Ambulatory Visit: Payer: 59 | Admitting: Obstetrics and Gynecology

## 2018-12-25 ENCOUNTER — Other Ambulatory Visit: Payer: Self-pay

## 2018-12-25 VITALS — BP 103/67 | HR 96 | Ht 62.0 in | Wt 132.5 lb

## 2018-12-25 DIAGNOSIS — Z3009 Encounter for other general counseling and advice on contraception: Secondary | ICD-10-CM

## 2018-12-25 DIAGNOSIS — N920 Excessive and frequent menstruation with regular cycle: Secondary | ICD-10-CM

## 2018-12-25 NOTE — Progress Notes (Signed)
Patient comes in today for GYN appointment. She states that she has been having heavier periods for the last three months.

## 2018-12-25 NOTE — Progress Notes (Signed)
HPI:      [name] is a 43 y.o. [id number] who LMP was Patient's last menstrual period was 12/20/2018.  Subjective:   She presents today to once again discuss birth control and in addition she has begun having very heavy menstrual bleeding that she would like controlled.  Prior to COVID-19 cancellations she was considering IUD for both birth control and cycle control.  She would like to revisit this issue.    Hx: The following portions of the patient's history were reviewed and updated as appropriate:             She  has a past medical history of Depression, HLD (hyperlipidemia), and Tension headache. She does not have any pertinent problems on file. She  has a past surgical history that includes Wisdom tooth extraction and Other surgical history. Her family history includes Cancer (age of onset: 12) in her mother; Cancer (age of onset: 55) in her cousin and paternal aunt; Coronary artery disease in her paternal grandfather; Hyperlipidemia in her father; Kidney disease (age of onset: 88) in her father. She  reports that she has never smoked. She has never used smokeless tobacco. She reports current alcohol use. She reports that she does not use drugs. She currently has no medications in their medication list. She has No Known Allergies.       Review of Systems:  Review of Systems  Constitutional: Denied constitutional symptoms, night sweats, recent illness, fatigue, fever, insomnia and weight loss.  Eyes: Denied eye symptoms, eye pain, photophobia, vision change and visual disturbance.  Ears/Nose/Throat/Neck: Denied ear, nose, throat or neck symptoms, hearing loss, nasal discharge, sinus congestion and sore throat.  Cardiovascular: Denied cardiovascular symptoms, arrhythmia, chest pain/pressure, edema, exercise intolerance, orthopnea and palpitations.  Respiratory: Denied pulmonary symptoms, asthma, pleuritic pain, productive sputum, cough, dyspnea and wheezing.  Gastrointestinal:  Denied, gastro-esophageal reflux, melena, nausea and vomiting.  Genitourinary: See HPI for additional information.  Musculoskeletal: Denied musculoskeletal symptoms, stiffness, swelling, muscle weakness and myalgia.  Dermatologic: Denied dermatology symptoms, rash and scar.  Neurologic: Denied neurology symptoms, dizziness, headache, neck pain and syncope.  Psychiatric: Denied psychiatric symptoms, anxiety and depression.  Endocrine: Denied endocrine symptoms including hot flashes and night sweats.   Meds:   No current outpatient medications on file prior to visit.   No current facility-administered medications on file prior to visit.     Objective:     Vitals:   12/25/18 1451  BP: 103/67  Pulse: 96                Assessment:    G2P2002 Patient Active Problem List   Diagnosis Date Noted  . Family history of kidney cancer 10/07/2013  . Healthcare maintenance 01/19/2012  . Family history of breast cancer in mother 01/19/2012  . VARICOSE VEINS, LOWER EXTREMITIES 12/23/2008  . ALLERGIC RHINITIS 07/29/2008  . HEADACHE, TENSION 02/12/2008  . GAD (generalized anxiety disorder) 09/12/2007  . SCOLIOSIS, MILD 09/03/2007  . HYPERLIPIDEMIA 08/08/2007     1. Menorrhagia with regular cycle   2. Birth control counseling     Patient possibly an climacteric with heavy bleeding for the first few days of her menses.  She describes bleeding through pads in an hour.   Plan:            1.  We discussed multiple options including use of NSAID S, OCPs and IUD.  Patient has reiterated her interest in IUD. Orders No orders of the defined types were placed in  this encounter.   No orders of the defined types were placed in this encounter.     F/U  No follow-ups on file. I spent 15 minutes involved in the care of this patient of which greater than 50% was spent discussing causes of menorrhagia, treatments of menorrhagia, birth control methods that both treat menorrhagia and get birth  control including OCPs and IUD.  All questions answered.  Finis Bud, M.D. 12/25/2018 3:10 PM

## 2018-12-26 ENCOUNTER — Encounter: Payer: Self-pay | Admitting: Obstetrics and Gynecology

## 2018-12-26 ENCOUNTER — Ambulatory Visit (INDEPENDENT_AMBULATORY_CARE_PROVIDER_SITE_OTHER): Payer: 59 | Admitting: Obstetrics and Gynecology

## 2018-12-26 VITALS — BP 102/46 | HR 82 | Ht 62.0 in | Wt 129.2 lb

## 2018-12-26 DIAGNOSIS — N920 Excessive and frequent menstruation with regular cycle: Secondary | ICD-10-CM

## 2018-12-26 DIAGNOSIS — Z3009 Encounter for other general counseling and advice on contraception: Secondary | ICD-10-CM | POA: Diagnosis not present

## 2018-12-26 DIAGNOSIS — Z30430 Encounter for insertion of intrauterine contraceptive device: Secondary | ICD-10-CM | POA: Diagnosis not present

## 2018-12-26 NOTE — Progress Notes (Signed)
Patient is here for IUD insert.

## 2018-12-26 NOTE — Progress Notes (Signed)
HPI:      [name] is a 43 y.o. [id number] who LMP was Patient's last menstrual period was 12/20/2018.  Subjective:   She presents today for IUD insertion.  She has an issue that very heavy menstrual bleeding and would like birth control.  She has chosen IUD to solve both of these problems.  She is currently on her menses.     Hx: The following portions of the patient's history were reviewed and updated as appropriate:             She  has a past medical history of Depression, HLD (hyperlipidemia), and Tension headache. She does not have any pertinent problems on file. She  has a past surgical history that includes Wisdom tooth extraction and Other surgical history. Her family history includes Cancer (age of onset: 54) in her mother; Cancer (age of onset: 22) in her cousin and paternal aunt; Coronary artery disease in her paternal grandfather; Hyperlipidemia in her father; Kidney disease (age of onset: 47) in her father. She  reports that she has never smoked. She has never used smokeless tobacco. She reports current alcohol use. She reports that she does not use drugs. She currently has no medications in their medication list. She has No Known Allergies.       Review of Systems:  Review of Systems  Constitutional: Denied constitutional symptoms, night sweats, recent illness, fatigue, fever, insomnia and weight loss.  Eyes: Denied eye symptoms, eye pain, photophobia, vision change and visual disturbance.  Ears/Nose/Throat/Neck: Denied ear, nose, throat or neck symptoms, hearing loss, nasal discharge, sinus congestion and sore throat.  Cardiovascular: Denied cardiovascular symptoms, arrhythmia, chest pain/pressure, edema, exercise intolerance, orthopnea and palpitations.  Respiratory: Denied pulmonary symptoms, asthma, pleuritic pain, productive sputum, cough, dyspnea and wheezing.  Gastrointestinal: Denied, gastro-esophageal reflux, melena, nausea and vomiting.  Genitourinary:  Denied genitourinary symptoms including symptomatic vaginal discharge, pelvic relaxation issues, and urinary complaints.  Musculoskeletal: Denied musculoskeletal symptoms, stiffness, swelling, muscle weakness and myalgia.  Dermatologic: Denied dermatology symptoms, rash and scar.  Neurologic: Denied neurology symptoms, dizziness, headache, neck pain and syncope.  Psychiatric: Denied psychiatric symptoms, anxiety and depression.  Endocrine: Denied endocrine symptoms including hot flashes and night sweats.   Meds:   No current outpatient medications on file prior to visit.   No current facility-administered medications on file prior to visit.     Objective:     Vitals:   12/26/18 1011  BP: (!) 102/46  Pulse: 82    Physical examination   Pelvic:   Vulva: Normal appearance.  No lesions.  Vagina: No lesions or abnormalities noted.  Support: Normal pelvic support.  Urethra No masses tenderness or scarring.  Meatus Normal size without lesions or prolapse.  Cervix: Normal appearance.  No lesions.  Anus: Normal exam.  No lesions.  Perineum: Normal exam.  No lesions.        Bimanual   Uterus: Normal size.  Non-tender.  Mobile.  AV.  Adnexae: No masses.  Non-tender to palpation.  Cul-de-sac: Negative for abnormality.   IUD Procedure Pt has read the booklet and signed the appropriate forms regarding the Mirena IUD.  All of her questions have been answered.   The cervix was cleansed with betadine solution.  After sounding the uterus and noting the position, the IUD was placed in the usual manner without problem.  The string was cut to the appropriate length.  The patient tolerated the procedure well.  Assessment:    [id number] Patient Active Problem List   Diagnosis Date Noted  . Family history of kidney cancer 10/07/2013  . Healthcare maintenance 01/19/2012  . Family history of breast cancer in mother 01/19/2012  . VARICOSE VEINS, LOWER EXTREMITIES 12/23/2008  .  ALLERGIC RHINITIS 07/29/2008  . HEADACHE, TENSION 02/12/2008  . GAD (generalized anxiety disorder) 09/12/2007  . SCOLIOSIS, MILD 09/03/2007  . HYPERLIPIDEMIA 08/08/2007     1. Menorrhagia with regular cycle   2. Birth control counseling       Plan:             F/U  Return in about 4 weeks (around 01/23/2019) for For IUD f/u.  Finis Bud, M.D. 12/26/2018 10:43 AM

## 2019-01-28 ENCOUNTER — Encounter: Payer: 59 | Admitting: Obstetrics and Gynecology

## 2019-02-10 ENCOUNTER — Observation Stay
Admission: EM | Admit: 2019-02-10 | Discharge: 2019-02-11 | Disposition: A | Discharge status: Home / Self Care | Payer: 59 | Attending: Obstetrics and Gynecology | Admitting: Obstetrics and Gynecology

## 2019-02-10 ENCOUNTER — Other Ambulatory Visit: Payer: Self-pay

## 2019-02-10 ENCOUNTER — Encounter: Payer: Self-pay | Admitting: Emergency Medicine

## 2019-02-10 DIAGNOSIS — N920 Excessive and frequent menstruation with regular cycle: Secondary | ICD-10-CM

## 2019-02-10 DIAGNOSIS — Z3202 Encounter for pregnancy test, result negative: Secondary | ICD-10-CM | POA: Insufficient documentation

## 2019-02-10 DIAGNOSIS — D500 Iron deficiency anemia secondary to blood loss (chronic): Secondary | ICD-10-CM | POA: Diagnosis not present

## 2019-02-10 DIAGNOSIS — N939 Abnormal uterine and vaginal bleeding, unspecified: Secondary | ICD-10-CM

## 2019-02-10 DIAGNOSIS — Z20828 Contact with and (suspected) exposure to other viral communicable diseases: Secondary | ICD-10-CM | POA: Diagnosis not present

## 2019-02-10 DIAGNOSIS — D649 Anemia, unspecified: Secondary | ICD-10-CM | POA: Diagnosis not present

## 2019-02-10 LAB — PREPARE RBC (CROSSMATCH)

## 2019-02-10 LAB — COMPREHENSIVE METABOLIC PANEL
ALT: 23 U/L (ref 0–44)
AST: 21 U/L (ref 15–41)
Albumin: 4.2 g/dL (ref 3.5–5.0)
Alkaline Phosphatase: 41 U/L (ref 38–126)
Anion gap: 9 (ref 5–15)
BUN: 15 mg/dL (ref 6–20)
CO2: 23 mmol/L (ref 22–32)
Calcium: 8.9 mg/dL (ref 8.9–10.3)
Chloride: 105 mmol/L (ref 98–111)
Creatinine, Ser: 0.72 mg/dL (ref 0.44–1.00)
GFR calc Af Amer: >60 mL/min (ref >60)
GFR calc non Af Amer: >60 mL/min (ref >60)
Glucose, Bld: 114 mg/dL — ABNORMAL HIGH (ref 70–99)
Potassium: 3.8 mmol/L (ref 3.5–5.1)
Sodium: 137 mmol/L (ref 135–145)
Total Bilirubin: 0.4 mg/dL (ref 0.3–1.2)
Total Protein: 6.9 g/dL (ref 6.5–8.1)

## 2019-02-10 LAB — HEMOGLOBIN AND HEMATOCRIT, BLOOD
HCT: 23.2 % — ABNORMAL LOW (ref 36.0–46.0)
Hemoglobin: 7.1 g/dL — ABNORMAL LOW (ref 12.0–15.0)

## 2019-02-10 LAB — ABO/RH: ABO/RH(D): O POS

## 2019-02-10 LAB — CBC
HCT: 16.6 % — ABNORMAL LOW (ref 36.0–46.0)
Hemoglobin: 4.5 g/dL — CL (ref 12.0–15.0)
MCH: 20.3 pg — ABNORMAL LOW (ref 26.0–34.0)
MCHC: 27.1 g/dL — ABNORMAL LOW (ref 30.0–36.0)
MCV: 74.8 fL — ABNORMAL LOW (ref 80.0–100.0)
Platelets: 367 10*3/uL (ref 150–400)
RBC: 2.22 MIL/uL — ABNORMAL LOW (ref 3.87–5.11)
RDW: 18.8 % — ABNORMAL HIGH (ref 11.5–15.5)
WBC: 11.3 10*3/uL — ABNORMAL HIGH (ref 4.0–10.5)
nRBC: 0.3 % — ABNORMAL HIGH (ref 0.0–0.2)

## 2019-02-10 LAB — POCT PREGNANCY, URINE: Preg Test, Ur: NEGATIVE

## 2019-02-10 LAB — PROTIME-INR
INR: 1 (ref 0.8–1.2)
Prothrombin Time: 13.4 seconds (ref 11.4–15.2)

## 2019-02-10 LAB — APTT: aPTT: 24 seconds (ref 24–36)

## 2019-02-10 LAB — TSH: TSH: 2.356 u[IU]/mL (ref 0.350–4.500)

## 2019-02-10 MED ORDER — ONDANSETRON HCL 4 MG/2ML IJ SOLN
4.0000 mg | Freq: Four times a day (QID) | INTRAMUSCULAR | Status: DC | PRN
  Administered 2019-02-11: 4 mg via INTRAVENOUS
  Filled 2019-02-10: qty 2

## 2019-02-10 MED ORDER — DOCUSATE SODIUM 100 MG PO CAPS
100.0000 mg | ORAL_CAPSULE | Freq: Two times a day (BID) | ORAL | Status: DC
  Administered 2019-02-10: 21:00:00 100 mg via ORAL
  Filled 2019-02-10: qty 1

## 2019-02-10 MED ORDER — ONDANSETRON HCL 4 MG PO TABS: 4.0000 mg | ORAL_TABLET | Freq: Four times a day (QID) | ORAL | Status: DC | PRN

## 2019-02-10 MED ORDER — ESTROGENS CONJUGATED 25 MG IJ SOLR
25.0000 mg | Freq: Once | INTRAMUSCULAR | Status: AC
  Administered 2019-02-10: 25 mg via INTRAVENOUS
  Filled 2019-02-10: qty 25

## 2019-02-10 MED ORDER — SODIUM CHLORIDE 0.9 % IV SOLN
10.0000 mL/h | Freq: Once | INTRAVENOUS | Status: AC
  Administered 2019-02-10: 10 mL/h via INTRAVENOUS

## 2019-02-10 MED ORDER — ACETAMINOPHEN 500 MG PO TABS
1000.0000 mg | ORAL_TABLET | Freq: Once | ORAL | Status: AC
  Administered 2019-02-10: 1000 mg via ORAL
  Filled 2019-02-10: qty 2

## 2019-02-10 MED ORDER — ALUM & MAG HYDROXIDE-SIMETH 200-200-20 MG/5ML PO SUSP: 30.0000 mL | ORAL | Status: DC | PRN

## 2019-02-10 MED ORDER — HYDROMORPHONE HCL 1 MG/ML IJ SOLN: 0.2000 mg | INTRAMUSCULAR | Status: DC | PRN

## 2019-02-10 MED ORDER — OXYCODONE-ACETAMINOPHEN 5-325 MG PO TABS: 1.0000 | ORAL_TABLET | ORAL | Status: DC | PRN

## 2019-02-10 MED ORDER — DIPHENHYDRAMINE HCL 25 MG PO CAPS
25.0000 mg | ORAL_CAPSULE | Freq: Once | ORAL | Status: AC
  Administered 2019-02-10: 25 mg via ORAL
  Filled 2019-02-10: qty 1

## 2019-02-10 MED ORDER — MAGNESIUM CITRATE PO SOLN
1.0000 | Freq: Once | ORAL | Status: DC | PRN
  Filled 2019-02-10: qty 296

## 2019-02-10 MED ORDER — ZOLPIDEM TARTRATE 5 MG PO TABS: 5.0000 mg | ORAL_TABLET | Freq: Every evening | ORAL | Status: DC | PRN

## 2019-02-10 MED ORDER — BISACODYL 5 MG PO TBEC: 5.0000 mg | DELAYED_RELEASE_TABLET | Freq: Every day | ORAL | Status: DC | PRN

## 2019-02-10 MED ORDER — LACTATED RINGERS IV SOLN
INTRAVENOUS | Status: DC
  Administered 2019-02-10 – 2019-02-11 (×2): via INTRAVENOUS

## 2019-02-10 MED ORDER — SODIUM CHLORIDE (PF) 0.9 % IJ SOLN
INTRAMUSCULAR | Status: AC
  Administered 2019-02-10: 21:00:00
  Filled 2019-02-10: qty 10

## 2019-02-10 MED ORDER — SODIUM CHLORIDE 0.9% IV SOLUTION
Freq: Once | INTRAVENOUS | Status: AC
  Administered 2019-02-10: 22:00:00 via INTRAVENOUS

## 2019-02-10 MED ORDER — MAGNESIUM HYDROXIDE 400 MG/5ML PO SUSP: 30.0000 mL | Freq: Every day | ORAL | Status: DC | PRN

## 2019-02-10 MED ORDER — IBUPROFEN 600 MG PO TABS: 600.0000 mg | ORAL_TABLET | Freq: Four times a day (QID) | ORAL | Status: DC | PRN

## 2019-02-10 NOTE — ED Provider Notes (Signed)
[facility] Emergency Department Provider Note  ____________________________________________   First MD Initiated Contact with Patient 02/10/19 1330     (approximate)  I have reviewed the triage vital signs and the nursing notes.   HISTORY  Chief Complaint Vaginal Bleeding    HPI Ashley Murray is a 43 y.o. female with a history of menorrhagia with regular periods, here for heavy vaginal bleeding.  Patient states since Thursday she has had to change a tampon and pad every 2-3 hours.  She recently had an IUD placed for her menorrhagia at the end of June, which she expulsed in a large clot on Thursday.  Over the last few days she has had increasing fatigue, dyspnea on exertion, prompting her to seek care.  She was seen in the [place] clinic, and referred to the ER for anemia.    She denies any personal or family history of bleeding issues.     Past Medical History:  Diagnosis Date  . Depression   . HLD (hyperlipidemia)   . Tension headache     Patient Active Problem List   Diagnosis Date Noted  . Family history of kidney cancer 10/07/2013  . Healthcare maintenance 01/19/2012  . Family history of breast cancer in mother 01/19/2012  . VARICOSE VEINS, LOWER EXTREMITIES 12/23/2008  . ALLERGIC RHINITIS 07/29/2008  . HEADACHE, TENSION 02/12/2008  . GAD (generalized anxiety disorder) 09/12/2007  . SCOLIOSIS, MILD 09/03/2007  . HYPERLIPIDEMIA 08/08/2007    Past Surgical History:  Procedure Laterality Date  . OTHER SURGICAL HISTORY     Hospitalized [facility] 2009 for muscle problems, EMGs of both upper arms  neg 09/12/07-Dr Carlis Abbott, neuro abd, US WNL 04/2009  . WISDOM TOOTH EXTRACTION      Prior to Admission medications   Not on File    Allergies Patient has no known allergies.  Family History  Problem Relation Age of Onset  . Cancer Mother 52       BRCA gene neg  . Coronary artery disease Paternal Grandfather        Bypass at older age  .  Hyperlipidemia Father   . Kidney disease Father 72       kidney polyp s/p benign biopsy  . Cancer Cousin 60       kidney  . Cancer Paternal Aunt 66       kidney  . Diabetes Neg Hx   . Stroke Neg Hx   . Hypertension Neg Hx     Social History Social History   Tobacco Use  . Smoking status: Never Smoker  . Smokeless tobacco: Never Used  Substance Use Topics  . Alcohol use: Yes    Comment: 1-2 glasses of wine a week  . Drug use: No    Review of Systems  Constitutional: Positive fatigue. Eyes: Denies visual changes. ENT: Denies sore throat. Cardiovascular: Denies chest pain. Respiratory: Positive shortness of breath on exertion. Gastrointestinal: Denies abdominal pain, nausea, or vomiting. Genitourinary: Denies for dysuria.  Positive for vaginal bleeding. Musculoskeletal: Denies for back pain. Skin: Denies for rash. Neurological: Denies for headaches.  ____________________________________________   PHYSICAL EXAM:  VITAL SIGNS: ED Triage Vitals  Enc Vitals Group     BP 02/10/19 1243 (!) 124/54     Pulse Rate 02/10/19 1243 92     Resp --      Temp 02/10/19 1243 98.2 F (36.8 C)     Temp Source 02/10/19 1243 Oral     SpO2 02/10/19 1243 100 %  Weight 02/10/19 1224 134 lb (60.8 kg)     Height 02/10/19 1224 _0  (1.575 m)     Head Circumference --      Peak Flow --      Pain Score 02/10/19 1224 1     Pain Loc --      Pain Edu? --      Excl. in [place]? --     Constitutional: Alert and oriented.  Eyes: Conjunctivae are pale. Sclera anicteric. Head: Normocephalic. Atraumatic. Nose: No congestion/rhinnorhea. Mouth/Throat: Mucous membranes are moist.  Neck: No stridor.   Cardiovascular: Normal rate, regular rhythm. Grossly normal heart sounds.  Good peripheral circulation. Respiratory: Normal respiratory effort.  No retractions. Lungs CTAB. Gastrointestinal: Soft and nontender. No distention.  Pelvic: RN chaperone presents.  Mild amount of blood and small clots  in the vaginal vault.  No active bleeding. Musculoskeletal: No lower extremity edema. Neurologic:  Normal speech and language. No gross focal neurologic deficits are appreciated.  Skin: Skin is warm, dry and intact. No rash noted. Psychiatric: Mood and affect are appropriate for situation. ____________________________________   PROCEDURES  Procedure(s) performed (including Critical Care):  .Critical Care Performed by: Lilia Pro., MD Authorized by: Lilia Pro., MD   Critical care provider statement:    Critical care time (minutes):  30   Critical care was time spent personally by me on the following activities:  Discussions with consultants, evaluation of patient's response to treatment, examination of patient, ordering and performing treatments and interventions, ordering and review of laboratory studies, ordering and review of radiographic studies, pulse oximetry, re-evaluation of patient's condition, obtaining history from patient or surrogate and review of old charts     ____________________________________________   INITIAL IMPRESSION / [place] / ED COURSE  42 year old female with a history of menorrhagia with regular periods, here for symptomatic anemia.  Had IUD placed at the end of June, which she expulsed in a large clot on Thursday.  Going through one tampon and pad every 2-3 hours since Thursday.  On exam VS stable. Conjunctiva are pale. Small amount of blood and clot in the vaginal vault, no active bleeding.  Labs initiated in triage reveal hemoglobin 4.5.  Discussed transfusion with patient including benefits and risks, who is agreeable.  We will plan to transfuse 2 units and discuss with OB/GYN.     ____________________________________________   FINAL CLINICAL IMPRESSION(S) / ED DIAGNOSES  Final diagnoses:  Vaginal bleeding  Blood loss anemia     Note:  This document was prepared using Dragon voice recognition software and may include  unintentional dictation errors.   Lilia Pro., MD 02/10/19 [phone]

## 2019-02-10 NOTE — ED Notes (Signed)
Attempted to call report. Awaiting return call.

## 2019-02-10 NOTE — ED Notes (Signed)
Updated on plan of care.  

## 2019-02-10 NOTE — ED Triage Notes (Signed)
Vaginal bleeding rfor 2-3 weeks excessive.  Went to [facility] and her hgb was down so  Sent here.

## 2019-02-10 NOTE — H&P (Addendum)
Reason for Consult: Menorrhagia and severe anemia Referring Physician: Derrell Lolling, MD (ER Physician)  Ashley Murray is an 43 y.o. [phone] female who presented to the Emergency Room with complaints of heavy vaginal bleeding since Thursday.  She was sent to the ER after being seen today at [facility] for complaints of heavy bleeding, with changing a tampon/pad every 2-3 hours.  She recently had a Mirena IUD placed ~ 4 weeks ago due to complaints of prolonged periods, however notes that she passed a large clot last weekand noticed that she had expelled the IUD.    She has been noting dizziness with position changes and fatigue for the past 3 days. She also has noticed some SOB with exertion.  She notes that her cycles have been heavy over the past several months.   Pertinent Gynecological History: Menses: flow is excessive with use of q 2-3 hr pads or tampons on heaviest days Bleeding: dysfunctional uterine bleeding Contraception: none currently  Recently had IUD expulsion last week.  Blood transfusions: currenly receiving blood transfusion of 2 units Sexually transmitted diseases: no past history Previous GYN Procedures: None Last pap: normal Date: 08/2016    Menstrual History: Menarche age: 19 Patient's last menstrual period was 02/10/2019.    Past Medical History:  Diagnosis Date  . Depression   . HLD (hyperlipidemia)   . Tension headache     OB History  Gravida Para Term Preterm AB Living  '2 2 2     2  ' SAB TAB Ectopic Multiple Live Births          2    # Outcome Date GA Lbr Len/2nd Weight Sex Delivery Anes PTL Lv  2 Term 2008     Vag-Spont   LIV  1 Term 2005     Vag-Spont   LIV    Past Surgical History:  Procedure Laterality Date  . OTHER SURGICAL HISTORY     Hospitalized [facility] 2009 for muscle problems, EMGs of both upper arms  neg 09/12/07-Dr Carlis Abbott, neuro abd, US WNL 04/2009  . WISDOM TOOTH EXTRACTION      Family History  Problem Relation Age of Onset  . Cancer  Mother 75       BRCA gene neg  . Coronary artery disease Paternal Grandfather        Bypass at older age  . Hyperlipidemia Father   . Kidney disease Father 31       kidney polyp s/p benign biopsy  . Cancer Cousin 60       kidney  . Cancer Paternal Aunt 25       kidney  . Diabetes Neg Hx   . Stroke Neg Hx   . Hypertension Neg Hx     Social History:  reports that she has never smoked. She has never used smokeless tobacco. She reports current alcohol use. She reports that she does not use drugs.  Allergies: No Known Allergies  Medications: I have reviewed the patient's current medications. Prior to Admission: (Not in a hospital admission)   Review of Systems  Constitutional: Positive for malaise/fatigue. Negative for chills, fever and weight loss.  HENT: Negative for congestion, ear discharge and sore throat.   Eyes: Negative for blurred vision, pain, discharge and redness.  Respiratory: Positive for shortness of breath. Negative for cough, sputum production and wheezing.   Cardiovascular: Negative for chest pain, palpitations, orthopnea and leg swelling.  Gastrointestinal: Negative for abdominal pain, heartburn, nausea and vomiting.  Genitourinary: Negative for dysuria, frequency,  hematuria and urgency.  Musculoskeletal: Negative for myalgias and neck pain.  Skin: Negative for itching and rash.  Neurological: Positive for dizziness. Negative for tingling, weakness and headaches.  Endo/Heme/Allergies: Negative.  Does not bruise/bleed easily.  Psychiatric/Behavioral: Negative.     Blood pressure 114/61, pulse 89, temperature 98.4 F (36.9 C), temperature source Oral, resp. rate 18, height '5\' 2"'  (1.575 m), weight 60.8 kg, last menstrual period 02/10/2019, SpO2 100 %. Physical Exam  Constitutional: She is oriented to person, place, and time. She appears well-developed and well-nourished. No distress.  HENT:  Head: Normocephalic and atraumatic.  Nose: Nose normal.   Mouth/Throat: Oropharynx is clear and moist.  Eyes: Pupils are equal, round, and reactive to light. Right eye exhibits no discharge. Left eye exhibits no discharge. No scleral icterus.  Neck: Normal range of motion. Neck supple. No tracheal deviation present. No thyromegaly present.  Cardiovascular: Normal rate, regular rhythm and normal heart sounds. Exam reveals no gallop and no friction rub.  No murmur heard. Respiratory: Effort normal and breath sounds normal. No respiratory distress. She exhibits no tenderness.  GI: Soft. Bowel sounds are normal. She exhibits no distension and no mass. There is no abdominal tenderness. There is no rebound and no guarding.  Genitourinary:    No vaginal discharge.     Genitourinary Comments: Vagina with small to moderate amount of bleeding, dark red blood.  Uterus normal contour, shape, size, nontender.   Musculoskeletal: Normal range of motion.        General: No tenderness, deformity or edema.  Lymphadenopathy:    She has no cervical adenopathy.  Neurological: She is alert and oriented to person, place, and time.  Skin: Skin is warm and dry. No rash noted. No erythema. There is pallor.  Psychiatric: She has a normal mood and affect. Her behavior is normal.    Results for orders placed or performed during the hospital encounter of 02/10/19 (from the past 48 hour(s))  Comprehensive metabolic panel     Status: Abnormal   Collection Time: 02/10/19 12:40 PM  Result Value Ref Range   Sodium 137 135 - 145 mmol/L   Potassium 3.8 3.5 - 5.1 mmol/L   Chloride 105 98 - 111 mmol/L   CO2 23 22 - 32 mmol/L   Glucose, Bld 114 (H) 70 - 99 mg/dL   BUN 15 6 - 20 mg/dL   Creatinine, Ser 0.72 0.44 - 1.00 mg/dL   Calcium 8.9 8.9 - 10.3 mg/dL   Total Protein 6.9 6.5 - 8.1 g/dL   Albumin 4.2 3.5 - 5.0 g/dL   AST 21 15 - 41 U/L   ALT 23 0 - 44 U/L   Alkaline Phosphatase 41 38 - 126 U/L   Total Bilirubin 0.4 0.3 - 1.2 mg/dL   GFR calc non Af Amer >60 >60 mL/min    GFR calc Af Amer >60 >60 mL/min   Anion gap 9 5 - 15    Comment: Performed at [facility], [address] [place], [place] [address]  CBC     Status: Abnormal   Collection Time: 02/10/19 12:40 PM  Result Value Ref Range   WBC 11.3 (H) 4.0 - 10.5 K/uL   RBC 2.22 (L) 3.87 - 5.11 MIL/uL   Hemoglobin 4.5 (LL) 12.0 - 15.0 g/dL    Comment: Reticulocyte Hemoglobin testing may be clinically indicated, consider ordering this additional test [id number] THIS CRITICAL RESULT HAS VERIFIED AND BEEN CALLED TO TIFFANY JOHNSON BY AMBER TRENT ON 08 03  2020 AT 1323, AND HAS BEEN READ BACK.     HCT 16.6 (L) 36.0 - 46.0 %   MCV 74.8 (L) 80.0 - 100.0 fL   MCH 20.3 (L) 26.0 - 34.0 pg   MCHC 27.1 (L) 30.0 - 36.0 g/dL   RDW 18.8 (H) 11.5 - 15.5 %   Platelets 367 150 - 400 K/uL   nRBC 0.3 (H) 0.0 - 0.2 %    Comment: Performed at [facility], [address] [place], [place] [address]  Type and screen [place]     Status: None (Preliminary result)   Collection Time: 02/10/19 12:40 PM  Result Value Ref Range   ABO/RH(D) O POS    Antibody Screen NEG    Sample Expiration 02/13/2019,2359    Unit Number [id number]    Blood Component Type RED CELLS,LR    Unit division 00    Status of Unit ISSUED    Transfusion Status OK TO TRANSFUSE    Crossmatch Result Compatible    Unit Number [id number]    Blood Component Type RED CELLS,LR    Unit division 00    Status of Unit ISSUED    Transfusion Status OK TO TRANSFUSE    Crossmatch Result      Compatible Performed at [facility], [address] [place], [place] [address]   Protime-INR     Status: None   Collection Time: 02/10/19 12:40 PM  Result Value Ref Range   Prothrombin Time 13.4 11.4 - 15.2 seconds   INR 1.0 0.8 - 1.2    Comment: (NOTE) INR goal varies based on device and disease states. Performed at [facility], [address] [place], [place] [address]   APTT     Status:  None   Collection Time: 02/10/19 12:40 PM  Result Value Ref Range   aPTT 24 24 - 36 seconds    Comment: Performed at [facility], [address] [place], [place] [address]  Pregnancy, urine POC     Status: None   Collection Time: 02/10/19 12:49 PM  Result Value Ref Range   Preg Test, Ur NEGATIVE NEGATIVE    Comment:        THE SENSITIVITY OF THIS METHODOLOGY IS >24 mIU/mL   Prepare RBC     Status: None   Collection Time: 02/10/19  1:32 PM  Result Value Ref Range   Order Confirmation      ORDER PROCESSED BY BLOOD BANK Performed at [facility], [address], [place], [place] [address]   ABO/Rh     Status: None   Collection Time: 02/10/19  3:03 PM  Result Value Ref Range   ABO/RH(D)      O POS Performed at [facility], [address] [place], [place] [address]      Assessment/Plan: 1. Symptomatic anemia - Hgb of 4.5patient s/p blood transfusion of 2 units in the Emergency Room. Will repeat CBC. Also with 1 additional unit on standby. Continue to monitor.  VS have improved since blood transfusion. No further need for telemetry at this time.  SARS testing ordered per protocol for admission to hospital.  2. Menorrhagia - s/p recent IUD expulsion.  Will treat acutely with IV estrogen dose 25 mg x 1 dose. Also discussed other management options, including replacement of IUD once acute bleeding has subsided, Depo Provera injections, progesterone-only (Aygestin), and surgical interventions such as D&C, endometrial ablation, or definitive management with hysterectomy.  Will also get  pelvic ultrasound to assess for any structural abnormalities.  TSH ordered.  Rubie Maid, MD [facility] 02/10/2019

## 2019-02-10 NOTE — ED Notes (Signed)
Sent rainbow with T&S to the lab.

## 2019-02-10 NOTE — ED Notes (Signed)
ED TO INPATIENT HANDOFF REPORT  ED Nurse Name and Phone #:  Quillian Quince [phone]  S Name/Age/Gender Ival Bible 43 y.o. female Room/Bed: ED03A/ED03A  Code Status   Code Status: Not on file  Home/SNF/Other Home Patient oriented to: self, place, time and situation Is this baseline? Yes   Triage Complete: Triage complete  Chief Complaint sent kernoodle, vag bld, sob  Triage Note Vaginal bleeding rfor 2-3 weeks excessive.  Went to [facility] and her hgb was down so  Sent here.   Allergies No Known Allergies  Level of Care/Admitting Diagnosis ED Disposition    ED Disposition Condition [facility]: [place] [[id number]]  Level of Care: Postpartum [19]  Covid Evaluation: Asymptomatic Screening Protocol (No Symptoms)  Diagnosis: Symptomatic anemia [2536644]  Admitting Physician: Renaldo Reel  Attending Physician: Rubie Maid [[id number]]  PT Class (Do Not Modify): Observation [104]  PT Acc Code (Do Not Modify): Observation [10022]       B Medical/Surgery History Past Medical History:  Diagnosis Date  . Depression   . HLD (hyperlipidemia)   . Tension headache    Past Surgical History:  Procedure Laterality Date  . OTHER SURGICAL HISTORY     Hospitalized [facility] 2009 for muscle problems, EMGs of both upper arms  neg 09/12/07-Dr Carlis Abbott, neuro abd, US WNL 04/2009  . WISDOM TOOTH EXTRACTION       A IV Location/Drains/Wounds Patient Lines/Drains/Airways Status   Active Line/Drains/Airways    Name:   Placement date:   Placement time:   Site:   Days:   Peripheral IV 02/10/19 Left Antecubital   02/10/19    1439    Antecubital   less than 1          Intake/Output Last 24 hours  Intake/Output Summary (Last 24 hours) at 02/10/2019 1917 Last data filed at 02/10/2019 1715 Gross per 24 hour  Intake 970 ml  Output -  Net 970 ml    Labs/Imaging Results for orders placed or performed during the hospital encounter of 02/10/19 (from  the past 48 hour(s))  Comprehensive metabolic panel     Status: Abnormal   Collection Time: 02/10/19 12:40 PM  Result Value Ref Range   Sodium 137 135 - 145 mmol/L   Potassium 3.8 3.5 - 5.1 mmol/L   Chloride 105 98 - 111 mmol/L   CO2 23 22 - 32 mmol/L   Glucose, Bld 114 (H) 70 - 99 mg/dL   BUN 15 6 - 20 mg/dL   Creatinine, Ser 0.72 0.44 - 1.00 mg/dL   Calcium 8.9 8.9 - 10.3 mg/dL   Total Protein 6.9 6.5 - 8.1 g/dL   Albumin 4.2 3.5 - 5.0 g/dL   AST 21 15 - 41 U/L   ALT 23 0 - 44 U/L   Alkaline Phosphatase 41 38 - 126 U/L   Total Bilirubin 0.4 0.3 - 1.2 mg/dL   GFR calc non Af Amer >60 >60 mL/min   GFR calc Af Amer >60 >60 mL/min   Anion gap 9 5 - 15    Comment: Performed at [facility], [address] [place],  [address]  CBC     Status: Abnormal   Collection Time: 02/10/19 12:40 PM  Result Value Ref Range   WBC 11.3 (H) 4.0 - 10.5 K/uL   RBC 2.22 (L) 3.87 - 5.11 MIL/uL   Hemoglobin 4.5 (LL) 12.0 - 15.0 g/dL    Comment: Reticulocyte Hemoglobin testing may be  clinically indicated, consider ordering this additional test [id number] THIS CRITICAL RESULT HAS VERIFIED AND BEEN CALLED TO TIFFANY JOHNSON BY AMBER TRENT ON 08 03 2020 AT 41, AND HAS BEEN READ BACK.     HCT 16.6 (L) 36.0 - 46.0 %   MCV 74.8 (L) 80.0 - 100.0 fL   MCH 20.3 (L) 26.0 - 34.0 pg   MCHC 27.1 (L) 30.0 - 36.0 g/dL   RDW 18.8 (H) 11.5 - 15.5 %   Platelets 367 150 - 400 K/uL   nRBC 0.3 (H) 0.0 - 0.2 %    Comment: Performed at [facility], [address] [place], [place] [address]  Type and screen [place]     Status: None (Preliminary result)   Collection Time: 02/10/19 12:40 PM  Result Value Ref Range   ABO/RH(D) O POS    Antibody Screen NEG    Sample Expiration 02/13/2019,2359    Unit Number [id number]    Blood Component Type RED CELLS,LR    Unit division 00    Status of Unit ISSUED    Transfusion Status OK TO TRANSFUSE    Crossmatch Result  Compatible    Unit Number [id number]    Blood Component Type RED CELLS,LR    Unit division 00    Status of Unit ISSUED    Transfusion Status OK TO TRANSFUSE    Crossmatch Result      Compatible Performed at [facility], [address] [place], [place] [address]   Protime-INR     Status: None   Collection Time: 02/10/19 12:40 PM  Result Value Ref Range   Prothrombin Time 13.4 11.4 - 15.2 seconds   INR 1.0 0.8 - 1.2    Comment: (NOTE) INR goal varies based on device and disease states. Performed at [facility], [address] [place], [place] [address]   APTT     Status: None   Collection Time: 02/10/19 12:40 PM  Result Value Ref Range   aPTT 24 24 - 36 seconds    Comment: Performed at [facility], [address] [place], [place] [address]  Pregnancy, urine POC     Status: None   Collection Time: 02/10/19 12:49 PM  Result Value Ref Range   Preg Test, Ur NEGATIVE NEGATIVE    Comment:        THE SENSITIVITY OF THIS METHODOLOGY IS >24 mIU/mL   Prepare RBC     Status: None   Collection Time: 02/10/19  1:32 PM  Result Value Ref Range   Order Confirmation      ORDER PROCESSED BY BLOOD BANK Performed at [facility], [address] [place], [place] [address]   ABO/Rh     Status: None   Collection Time: 02/10/19  3:03 PM  Result Value Ref Range   ABO/RH(D)      O POS Performed at [facility], [address] [place], [place] [address]    No results found.  Pending Labs [employer] (From admission, onward)    Start     Ordered   02/10/19 1909  SARS CORONAVIRUS 2 Nasal Swab Aptima Multi Swab  (Asymptomatic Patients Labs)  Once,   STAT    Question Answer Comment  Is this test for diagnosis or screening Screening   Symptomatic for COVID-19 as defined by CDC No   Hospitalized for COVID-19 No   Admitted to ICU for COVID-19 No   Previously tested for COVID-19 No  Resident in a congregate (group) care setting  No   Employed in healthcare setting No   Pregnant No      02/10/19 1908   Signed and Held  CBC WITH DIFFERENTIAL  Once,   R     Signed and Held   Signed and Held  Prepare RBC  (Adult Blood Administration - Red Blood Cells)  ONCE - STAT,   STAT    Question Answer Comment  # of Units 1 unit   Transfusion Indications Symptomatic Anemia   If emergent release call blood bank Not emergent release      Signed and Held          Vitals/Pain Today's Vitals   02/10/19 1730 02/10/19 1808 02/10/19 1848 02/10/19 1851  BP: 109/60  114/61   Pulse: 82  89   Resp: 16  18   Temp: 98.5 F (36.9 C)  98.4 F (36.9 C)   TempSrc: Oral  Oral   SpO2: 100%  100%   Weight:      Height:      PainSc:  0-No pain  0-No pain    Isolation Precautions No active isolations  Medications Medications  0.9 %  sodium chloride infusion (0 mL/hr Intravenous Stopped 02/10/19 1658)    Mobility walks Low fall risk   Focused Assessments Skin/Genitourinary   R Recommendations: See Admitting Provider Note  Report given to:   Additional Notes:

## 2019-02-11 ENCOUNTER — Inpatient Hospital Stay: Payer: 59

## 2019-02-11 ENCOUNTER — Encounter: Payer: 59 | Admitting: Obstetrics and Gynecology

## 2019-02-11 DIAGNOSIS — D649 Anemia, unspecified: Secondary | ICD-10-CM | POA: Diagnosis present

## 2019-02-11 DIAGNOSIS — N920 Excessive and frequent menstruation with regular cycle: Secondary | ICD-10-CM | POA: Diagnosis not present

## 2019-02-11 LAB — COMPREHENSIVE METABOLIC PANEL
ALT: 23 U/L (ref 0–44)
AST: 25 U/L (ref 15–41)
Albumin: 3.3 g/dL — ABNORMAL LOW (ref 3.5–5.0)
Alkaline Phosphatase: 34 U/L — ABNORMAL LOW (ref 38–126)
Anion gap: 6 (ref 5–15)
BUN: 15 mg/dL (ref 6–20)
CO2: 24 mmol/L (ref 22–32)
Calcium: 8 mg/dL — ABNORMAL LOW (ref 8.9–10.3)
Chloride: 109 mmol/L (ref 98–111)
Creatinine, Ser: 0.72 mg/dL (ref 0.44–1.00)
GFR calc Af Amer: >60 mL/min (ref >60)
GFR calc non Af Amer: >60 mL/min (ref >60)
Glucose, Bld: 91 mg/dL (ref 70–99)
Potassium: 3.7 mmol/L (ref 3.5–5.1)
Sodium: 139 mmol/L (ref 135–145)
Total Bilirubin: 1.5 mg/dL — ABNORMAL HIGH (ref 0.3–1.2)
Total Protein: 5.5 g/dL — ABNORMAL LOW (ref 6.5–8.1)

## 2019-02-11 LAB — BPAM RBC
Blood Product Expiration Date: 202008162359
Blood Product Expiration Date: 202008172359
Blood Product Expiration Date: 202008172359
ISSUE DATE / TIME: 202008031532
ISSUE DATE / TIME: 202008031706
ISSUE DATE / TIME: 202008032159
Unit Type and Rh: 5100
Unit Type and Rh: 5100
Unit Type and Rh: 5100

## 2019-02-11 LAB — TYPE AND SCREEN
ABO/RH(D): O POS
Antibody Screen: NEGATIVE
Unit division: 0
Unit division: 0
Unit division: 0

## 2019-02-11 LAB — CBC
HCT: 26.2 % — ABNORMAL LOW (ref 36.0–46.0)
Hemoglobin: 8.2 g/dL — ABNORMAL LOW (ref 12.0–15.0)
MCH: 24.5 pg — ABNORMAL LOW (ref 26.0–34.0)
MCHC: 31.3 g/dL (ref 30.0–36.0)
MCV: 78.2 fL — ABNORMAL LOW (ref 80.0–100.0)
Platelets: 249 10*3/uL (ref 150–400)
RBC: 3.35 MIL/uL — ABNORMAL LOW (ref 3.87–5.11)
RDW: 18.7 % — ABNORMAL HIGH (ref 11.5–15.5)
WBC: 10.3 10*3/uL (ref 4.0–10.5)
nRBC: 0.3 % — ABNORMAL HIGH (ref 0.0–0.2)

## 2019-02-11 LAB — SARS CORONAVIRUS 2 (TAT 6-24 HRS): SARS Coronavirus 2: NEGATIVE

## 2019-02-11 MED ORDER — NORETHINDRONE ACETATE 5 MG PO TABS: 5.0000 mg | ORAL_TABLET | Freq: Two times a day (BID) | ORAL | 0 refills | Status: DC

## 2019-02-11 MED ORDER — ESTROGENS CONJUGATED 0.625 MG PO TABS: ORAL_TABLET | ORAL | 1 refills | Status: DC

## 2019-02-11 NOTE — Progress Notes (Signed)
Patient wheeled out by auxiliary.  

## 2019-02-11 NOTE — Progress Notes (Signed)
Patient to be discharged home. Discharge instructions, prescriptions and follow up appointment given to and reviewed with patient. Patient verbalized understanding. Waiting on ride

## 2019-02-11 NOTE — Discharge Summary (Signed)
    Discharge Summary  Admit date: 02/10/2019  Discharge Date and Time:02/11/2019  1:21 PM  Discharge to:  Home  Admission Diagnosis: Present on Admission: . Symptomatic anemia . Anemia                     Discharge  Diagnoses: Active Problems:   Symptomatic anemia   Anemia  Patient received 3 units of packed red cells during her stay                             Discharge Day Progress Note:   Subjective:   The patient does not have complaints.  She is ambulating well. She is taking PO well. Her pain is well controlled with her current medications. She is urinating without difficulty and is passing flatus.   Objective:  BP (!) 102/55 (BP Location: Left Arm)   Pulse 81   Temp 98.6 F (37 C) (Oral)   Resp 20   Ht 5\' 2"  (1.575 m)   Wt 60.8 kg   LMP 02/10/2019   SpO2 100%   BMI 24.51 kg/m                                Assessment:   Doing well.  She is now asymptomatic.   Ultrasound reveals uterine fibroid submucosal   Bleeding now controlled  Plan:        Discharge home.                       Medications as directed.  Premarin and Aygestin    Iron twice daily  Hospital Course:  No notes on file   Condition at Discharge:  Good  stable Discharge Medications:  Allergies as of 02/11/2019   No Known Allergies     Medication List    TAKE these medications   estrogens (conjugated) 0.625 MG tablet Commonly known as: Premarin Take one pill daily   multivitamin with minerals Tabs tablet Take 1 tablet by mouth daily.   norethindrone 5 MG tablet Commonly known as: AYGESTIN Take 1 tablet (5 mg total) by mouth 2 (two) times daily. As directed        Follow Up:   Follow-up Information    Harlin Heys, MD Follow up in 2 week(s).   Specialties: Obstetrics and Gynecology, Radiology Contact information: [place] [address] [phone]           Finis Bud, M.D. 02/11/2019 1:21 PM

## 2019-02-11 NOTE — Discharge Instructions (Signed)
Abnormal Uterine Bleeding Abnormal uterine bleeding means bleeding more than usual from your uterus. It can include:  Bleeding between periods.  Bleeding after sex.  Bleeding that is heavier than normal.  Periods that last longer than usual.  Bleeding after you have stopped having your period (menopause). There are many problems that may cause this. You should see a doctor for any kind of bleeding that is not normal. Treatment depends on the cause of the bleeding. Follow these instructions at home:  Watch your condition for any changes.  Do not use tampons, douche, or have sex, if your doctor tells you not to.  Change your pads often.  Get regular well-woman exams. Make sure they include a pelvic exam and cervical cancer screening.  Keep all follow-up visits as told by your doctor. This is important. Contact a doctor if:  The bleeding lasts more than one week.  You feel dizzy at times.  You feel like you are going to throw up (nauseous).  You throw up. Get help right away if:  You pass out.  You have to change pads every hour.  You have belly (abdominal) pain.  You have a fever.  You get sweaty.  You get weak.  You passing large blood clots from your vagina. Summary  Abnormal uterine bleeding means bleeding more than usual from your uterus.  There are many problems that may cause this. You should see a doctor for any kind of bleeding that is not normal.  Treatment depends on the cause of the bleeding. This information is not intended to replace advice given to you by your health care provider. Make sure you discuss any questions you have with your health care provider. Document Released: 04/23/2009 Document Revised: 06/20/2016 Document Reviewed: 06/20/2016 Elsevier Patient Education  [place].   Anemia  Anemia is a condition in which you do not have enough red blood cells or hemoglobin. Hemoglobin is a substance in red blood cells that carries  oxygen. When you do not have enough red blood cells or hemoglobin (are anemic), your body cannot get enough oxygen and your organs may not work properly. As a result, you may feel very tired or have other problems. What are the causes? Common causes of anemia include:  Excessive bleeding. Anemia can be caused by excessive bleeding inside or outside the body, including bleeding from the intestine or from periods in women.  Poor nutrition.  Long-lasting (chronic) kidney, thyroid, and liver disease.  Bone marrow disorders.  Cancer and treatments for cancer.  HIV (human immunodeficiency virus) and AIDS (acquired immunodeficiency syndrome).  Treatments for HIV and AIDS.  Spleen problems.  Blood disorders.  Infections, medicines, and autoimmune disorders that destroy red blood cells. What are the signs or symptoms? Symptoms of this condition include:  Minor weakness.  Dizziness.  Headache.  Feeling heartbeats that are irregular or faster than normal (palpitations).  Shortness of breath, especially with exercise.  Paleness.  Cold sensitivity.  Indigestion.  Nausea.  Difficulty sleeping.  Difficulty concentrating. Symptoms may occur suddenly or develop slowly. If your anemia is mild, you may not have symptoms. How is this diagnosed? This condition is diagnosed based on:  Blood tests.  Your medical history.  A physical exam.  Bone marrow biopsy. Your health care provider may also check your stool (feces) for blood and may do additional testing to look for the cause of your bleeding. You may also have other tests, including:  Imaging tests, such as a CT scan  or MRI.  Endoscopy.  Colonoscopy. How is this treated? Treatment for this condition depends on the cause. If you continue to lose a lot of blood, you may need to be treated at a hospital. Treatment may include:  Taking supplements of iron, vitamin D25, or folic acid.  Taking a hormone medicine  (erythropoietin) that can help to stimulate red blood cell growth.  Having a blood transfusion. This may be needed if you lose a lot of blood.  Making changes to your diet.  Having surgery to remove your spleen. Follow these instructions at home:  Take over-the-counter and prescription medicines only as told by your health care provider.  Take supplements only as told by your health care provider.  Follow any diet instructions that you were given.  Keep all follow-up visits as told by your health care provider. This is important. Contact a health care provider if:  You develop new bleeding anywhere in the body. Get help right away if:  You are very weak.  You are short of breath.  You have pain in your abdomen or chest.  You are dizzy or feel faint.  You have trouble concentrating.  You have bloody or black, tarry stools.  You vomit repeatedly or you vomit up blood. Summary  Anemia is a condition in which you do not have enough red blood cells or enough of a substance in your red blood cells that carries oxygen (hemoglobin).  Symptoms may occur suddenly or develop slowly.  If your anemia is mild, you may not have symptoms.  This condition is diagnosed with blood tests as well as a medical history and physical exam. Other tests may be needed.  Treatment for this condition depends on the cause of the anemia. This information is not intended to replace advice given to you by your health care provider. Make sure you discuss any questions you have with your health care provider. Document Released: 08/03/2004 Document Revised: 06/08/2017 Document Reviewed: 07/28/2016 Elsevier Patient Education  2020 [employer].

## 2019-02-26 ENCOUNTER — Ambulatory Visit: Payer: 59 | Admitting: Obstetrics and Gynecology

## 2019-02-26 ENCOUNTER — Other Ambulatory Visit: Payer: Self-pay

## 2019-02-26 ENCOUNTER — Encounter: Payer: Self-pay | Admitting: Obstetrics and Gynecology

## 2019-02-26 VITALS — BP 104/63 | HR 76 | Ht 62.0 in | Wt 130.0 lb

## 2019-02-26 DIAGNOSIS — Z30011 Encounter for initial prescription of contraceptive pills: Secondary | ICD-10-CM | POA: Diagnosis not present

## 2019-02-26 DIAGNOSIS — N921 Excessive and frequent menstruation with irregular cycle: Secondary | ICD-10-CM

## 2019-02-26 DIAGNOSIS — D250 Submucous leiomyoma of uterus: Secondary | ICD-10-CM | POA: Diagnosis not present

## 2019-02-26 NOTE — Progress Notes (Signed)
HPI:      [name] is a 43 y.o. [id number] who LMP was Patient's last menstrual period was 02/10/2019.  Subjective:   She presents today after being seen in the hospital for acute blood loss anemia and given 3 units of packed cells.  She was also found to have a submucosal fibroid.  She is currently taking Premarin in postmenopausal doses to keep from bleeding.  She states that she occasionally has spotting but nothing heavy.  She is considering options for definitive management as she previously failed IUD.  Her bleeding was so heavy that she expelled the IUD.    Hx: The following portions of the patient's history were reviewed and updated as appropriate:             She  has a past medical history of Anemia, Depression, HLD (hyperlipidemia), and Tension headache. She does not have any pertinent problems on file. She  has a past surgical history that includes Wisdom tooth extraction and Other surgical history. Her family history includes Cancer (age of onset: 65) in her mother; Cancer (age of onset: 53) in her cousin and paternal aunt; Coronary artery disease in her paternal grandfather; Hyperlipidemia in her father; Kidney disease (age of onset: 61) in her father. She  reports that she has never smoked. She has never used smokeless tobacco. She reports current alcohol use. She reports that she does not use drugs. She has a current medication list which includes the following prescription(s): estrogens (conjugated), multivitamin with minerals, and norethindrone. She has No Known Allergies.       Review of Systems:  Review of Systems  Constitutional: Denied constitutional symptoms, night sweats, recent illness, fatigue, fever, insomnia and weight loss.  Eyes: Denied eye symptoms, eye pain, photophobia, vision change and visual disturbance.  Ears/Nose/Throat/Neck: Denied ear, nose, throat or neck symptoms, hearing loss, nasal discharge, sinus congestion and sore throat.  Cardiovascular:  Denied cardiovascular symptoms, arrhythmia, chest pain/pressure, edema, exercise intolerance, orthopnea and palpitations.  Respiratory: Denied pulmonary symptoms, asthma, pleuritic pain, productive sputum, cough, dyspnea and wheezing.  Gastrointestinal: Denied, gastro-esophageal reflux, melena, nausea and vomiting.  Genitourinary: See HPI for additional information.  Musculoskeletal: Denied musculoskeletal symptoms, stiffness, swelling, muscle weakness and myalgia.  Dermatologic: Denied dermatology symptoms, rash and scar.  Neurologic: Denied neurology symptoms, dizziness, headache, neck pain and syncope.  Psychiatric: Denied psychiatric symptoms, anxiety and depression.  Endocrine: Denied endocrine symptoms including hot flashes and night sweats.   Meds:   Current Outpatient Medications on File Prior to Visit  Medication Sig Dispense Refill  . estrogens, conjugated, (PREMARIN) 0.625 MG tablet Take one pill daily 30 tablet 1  . Multiple Vitamin (MULTIVITAMIN WITH MINERALS) TABS tablet Take 1 tablet by mouth daily.    . norethindrone (AYGESTIN) 5 MG tablet Take 1 tablet (5 mg total) by mouth 2 (two) times daily. As directed 120 tablet 0   No current facility-administered medications on file prior to visit.     Objective:     Vitals:   02/26/19 0841  BP: 104/63  Pulse: 76                Assessment:    G2P2002 Patient Active Problem List   Diagnosis Date Noted  . Anemia 02/11/2019  . Symptomatic anemia 02/10/2019  . Family history of kidney cancer 10/07/2013  . Healthcare maintenance 01/19/2012  . Family history of breast cancer in mother 01/19/2012  . VARICOSE VEINS, LOWER EXTREMITIES 12/23/2008  . ALLERGIC RHINITIS 07/29/2008  .  HEADACHE, TENSION 02/12/2008  . GAD (generalized anxiety disorder) 09/12/2007  . SCOLIOSIS, MILD 09/03/2007  . HYPERLIPIDEMIA 08/08/2007     1. Fibroids, submucosal   2. Menorrhagia with irregular cycle   3. Initiation of OCP (BCP)      Bleeding currently controlled.  Patient considering surgery in October.   Plan:            1.  We discussed uterine fibroids in detail.  Control of bleeding discussed in detail hormonal versus surgical options.  I discussed the possibility of fibroid embolization as well as endometrial ablation as well as definitive management with hysterectomy.  We have briefly discussed nephrectomy at the time of hysterectomy and the risks and benefits were discussed.  Patient will consider these options.  She has declined embolization and ablation.  She would like to undergo laparoscopic hysterectomy in October.  2.  In an attempt to keep her from bleeding until the time of surgery I have placed her on OCPs and she will discontinue the 0.625 of Premarin.  She is to call if her bleeding becomes heavy and will try to immediately fix it with hormonal manipulation. Orders No orders of the defined types were placed in this encounter.   No orders of the defined types were placed in this encounter.     F/U  Return in about 1 month (around 03/29/2019). I spent 21 minutes involved in the care of this patient of which greater than 50% was spent discussing uterine fibroids, menorrhagia, hormonal methods of control, possible surgical options as listed above, risk benefits of each, future hormone replacement therapy.  All questions answered.  Finis Bud, M.D. 02/26/2019 9:10 AM

## 2019-03-02 ENCOUNTER — Other Ambulatory Visit: Payer: Self-pay

## 2019-03-02 ENCOUNTER — Emergency Department
Admission: EM | Admit: 2019-03-02 | Discharge: 2019-03-02 | Disposition: A | Discharge status: Home / Self Care | Payer: 59 | Attending: Emergency Medicine | Admitting: Emergency Medicine

## 2019-03-02 DIAGNOSIS — N939 Abnormal uterine and vaginal bleeding, unspecified: Secondary | ICD-10-CM | POA: Insufficient documentation

## 2019-03-02 LAB — CBC
HCT: 32.7 % — ABNORMAL LOW (ref 36.0–46.0)
HCT: 31.5 % — ABNORMAL LOW (ref 36.0–46.0)
Hemoglobin: 9.7 g/dL — ABNORMAL LOW (ref 12.0–15.0)
Hemoglobin: 9.1 g/dL — ABNORMAL LOW (ref 12.0–15.0)
MCH: 24.6 pg — ABNORMAL LOW (ref 26.0–34.0)
MCH: 24.5 pg — ABNORMAL LOW (ref 26.0–34.0)
MCHC: 29.7 g/dL — ABNORMAL LOW (ref 30.0–36.0)
MCHC: 28.9 g/dL — ABNORMAL LOW (ref 30.0–36.0)
MCV: 83 fL (ref 80.0–100.0)
MCV: 84.7 fL (ref 80.0–100.0)
Platelets: 423 10*3/uL — ABNORMAL HIGH (ref 150–400)
Platelets: 421 10*3/uL — ABNORMAL HIGH (ref 150–400)
RBC: 3.94 MIL/uL (ref 3.87–5.11)
RBC: 3.72 MIL/uL — ABNORMAL LOW (ref 3.87–5.11)
RDW: 20.9 % — ABNORMAL HIGH (ref 11.5–15.5)
RDW: 20.9 % — ABNORMAL HIGH (ref 11.5–15.5)
WBC: 5.2 10*3/uL (ref 4.0–10.5)
WBC: 6.5 10*3/uL (ref 4.0–10.5)
nRBC: 0 % (ref 0.0–0.2)
nRBC: 0 % (ref 0.0–0.2)

## 2019-03-02 LAB — BASIC METABOLIC PANEL
Anion gap: 9 (ref 5–15)
BUN: 17 mg/dL (ref 6–20)
CO2: 22 mmol/L (ref 22–32)
Calcium: 8.9 mg/dL (ref 8.9–10.3)
Chloride: 110 mmol/L (ref 98–111)
Creatinine, Ser: 0.79 mg/dL (ref 0.44–1.00)
GFR calc Af Amer: >60 mL/min (ref >60)
GFR calc non Af Amer: >60 mL/min (ref >60)
Glucose, Bld: 103 mg/dL — ABNORMAL HIGH (ref 70–99)
Potassium: 4 mmol/L (ref 3.5–5.1)
Sodium: 141 mmol/L (ref 135–145)

## 2019-03-02 LAB — TYPE AND SCREEN
ABO/RH(D): O POS
Antibody Screen: NEGATIVE

## 2019-03-02 MED ORDER — ESTROGENS CONJUGATED 0.625 MG PO TABS
0.6250 mg | ORAL_TABLET | Freq: Once | ORAL | Status: AC
  Administered 2019-03-02: 0.625 mg via ORAL
  Filled 2019-03-02: qty 1

## 2019-03-02 NOTE — ED Notes (Signed)
Pt given pad per request for change due to saturation with blood. Jimmye Norman MD notified.

## 2019-03-02 NOTE — ED Provider Notes (Signed)
[facility] Emergency Department Provider Note       Time seen: ----------------------------------------- 10:59 AM on 03/02/2019 -----------------------------------------   I have reviewed the triage vital signs and the nursing notes.  HISTORY   Chief Complaint Vaginal Bleeding    HPI Ashley Murray is a 43 y.o. female with a history of anemia, depression, hyperlipidemia, tension headache who presents to the ED for vaginal bleeding that started 830 this morning.  Patient states she is bleeding through 1 pad at least every hour, was recently seen here for same and had blood transfusions.  She is also followed up with OB/GYN since her last hospitalization and is currently taking birth control for same.  She denies any dizziness or weakness, states she is having some abdominal cramping.  Past Medical History:  Diagnosis Date  . Anemia   . Depression   . HLD (hyperlipidemia)   . Tension headache     Patient Active Problem List   Diagnosis Date Noted  . Anemia 02/11/2019  . Symptomatic anemia 02/10/2019  . Family history of kidney cancer 10/07/2013  . Healthcare maintenance 01/19/2012  . Family history of breast cancer in mother 01/19/2012  . VARICOSE VEINS, LOWER EXTREMITIES 12/23/2008  . ALLERGIC RHINITIS 07/29/2008  . HEADACHE, TENSION 02/12/2008  . GAD (generalized anxiety disorder) 09/12/2007  . SCOLIOSIS, MILD 09/03/2007  . HYPERLIPIDEMIA 08/08/2007    Past Surgical History:  Procedure Laterality Date  . OTHER SURGICAL HISTORY     Hospitalized [facility] 2009 for muscle problems, EMGs of both upper arms  neg 09/12/07-Dr Carlis Abbott, neuro abd, US WNL 04/2009  . WISDOM TOOTH EXTRACTION      Allergies Patient has no known allergies.  Social History Social History   Tobacco Use  . Smoking status: Never Smoker  . Smokeless tobacco: Never Used  Substance Use Topics  . Alcohol use: Yes    Comment: 1-2 glasses of wine a week  . Drug use: No    Review of Systems Constitutional: Negative for fever. Cardiovascular: Negative for chest pain. Respiratory: Negative for shortness of breath. Gastrointestinal: Negative for abdominal pain, vomiting and diarrhea. Genitourinary: Positive for vaginal bleeding and cramping Musculoskeletal: Negative for back pain. Skin: Negative for rash. Neurological: Negative for headaches, focal weakness or numbness.  All systems negative/normal/unremarkable except as stated in the HPI  ____________________________________________   PHYSICAL EXAM:  VITAL SIGNS: ED Triage Vitals [03/02/19 1028]  Enc Vitals Group     BP (!) 145/72     Pulse Rate 73     Resp 18     Temp 98.1 F (36.7 C)     Temp Source Oral     SpO2 97 %     Weight 130 lb (59 kg)     Height 5\' 2"  (1.575 m)     Head Circumference      Peak Flow      Pain Score 5     Pain Loc      Pain Edu?      Excl. in [place]?    Constitutional: Alert and oriented. Well appearing and in no distress. Eyes: Conjunctivae are normal. Normal extraocular movements. Cardiovascular: Normal rate, regular rhythm. No murmurs, rubs, or gallops. Respiratory: Normal respiratory effort without tachypnea nor retractions. Breath sounds are clear and equal bilaterally. No wheezes/rales/rhonchi. Gastrointestinal: Soft and nontender. Normal bowel sounds Musculoskeletal: Nontender with normal range of motion in extremities. No lower extremity tenderness nor edema. Neurologic:  Normal speech and language. No gross focal neurologic deficits are  appreciated.  Skin:  Skin is warm, dry and intact.  Pallor is noted Psychiatric: Mood and affect are normal. Speech and behavior are normal.  ____________________________________________  ED COURSE:  As part of my medical decision making, I reviewed the following data within the [place] History obtained from family if available, nursing notes, old chart and ekg, as well as notes from prior ED  visits. Patient presented for heavy vaginal bleeding with a history of same, we will assess with labs as indicated at this time.   Procedures  Ashley Murray was evaluated in Emergency Department on 03/02/2019 for the symptoms described in the history of present illness. She was evaluated in the context of the global COVID-19 pandemic, which necessitated consideration that the patient might be at risk for infection with the SARS-CoV-2 virus that causes COVID-19. Institutional protocols and algorithms that pertain to the evaluation of patients at risk for COVID-19 are in a state of rapid change based on information released by regulatory bodies including the CDC and federal and state organizations. These policies and algorithms were followed during the patient's care in the ED.  ____________________________________________   LABS (pertinent positives/negatives)  Labs Reviewed  CBC - Abnormal; Notable for the following components:      Result Value   Hemoglobin 9.7 (*)    HCT 32.7 (*)    MCH 24.6 (*)    MCHC 29.7 (*)    RDW 20.9 (*)    Platelets 423 (*)    All other components within normal limits  BASIC METABOLIC PANEL - Abnormal; Notable for the following components:   Glucose, Bld 103 (*)    All other components within normal limits  CBC - Abnormal; Notable for the following components:   RBC 3.72 (*)    Hemoglobin 9.1 (*)    HCT 31.5 (*)    MCH 24.5 (*)    MCHC 28.9 (*)    RDW 20.9 (*)    Platelets 421 (*)    All other components within normal limits  TYPE AND SCREEN   ____________________________________________   DIFFERENTIAL DIAGNOSIS   Menorrhagia, uterine fibroid, anemia  FINAL ASSESSMENT AND PLAN  Abnormal uterine bleeding   Plan: The patient had presented for recurrent uterine bleeding. Patient's labs revealed a very mild decrease in her hemoglobin while she was here.  Have discussed with OB/GYN on-call who recommends going to Premarin twice a day.  She will stay  on the Aygestin twice a day.  She will follow-up with her GYN doctor soon as possible for recheck.   Laurence Aly, MD    Note: This note was generated in part or whole with voice recognition software. Voice recognition is usually quite accurate but there are transcription errors that can and very often do occur. I apologize for any typographical errors that were not detected and corrected.     Earleen Newport, MD 03/02/19 [phone]

## 2019-03-02 NOTE — ED Notes (Signed)
Pt reports soaking vaginal pad in less than an hour with bright red blood. Recently seen for same.

## 2019-03-02 NOTE — ED Triage Notes (Signed)
Pt arrived via POV with husband with reports of vaginal bleeding that started around 0830. Pt bleeding through 1 pad at least every hour.  Pt here a few weeks ago for the same, told she had a fibroid and pt had to receive blood transfusions for low hgb.  Pt denies any dizziness or weakness, states she is having some abdominal cramping.

## 2019-03-05 ENCOUNTER — Other Ambulatory Visit: Payer: 59

## 2019-03-05 ENCOUNTER — Other Ambulatory Visit: Payer: Self-pay | Admitting: Family Medicine

## 2019-03-05 DIAGNOSIS — D649 Anemia, unspecified: Secondary | ICD-10-CM

## 2019-03-05 DIAGNOSIS — E782 Mixed hyperlipidemia: Secondary | ICD-10-CM

## 2019-03-10 ENCOUNTER — Encounter: Payer: 59 | Admitting: Family Medicine

## 2019-03-25 ENCOUNTER — Other Ambulatory Visit: Payer: Self-pay | Admitting: Surgical

## 2019-03-25 MED ORDER — ESTROGENS CONJUGATED 0.625 MG PO TABS: ORAL_TABLET | ORAL | 1 refills | Status: DC

## 2019-03-27 ENCOUNTER — Encounter: Payer: Self-pay | Admitting: Obstetrics and Gynecology

## 2019-03-27 ENCOUNTER — Ambulatory Visit (INDEPENDENT_AMBULATORY_CARE_PROVIDER_SITE_OTHER): Payer: 59 | Admitting: Obstetrics and Gynecology

## 2019-03-27 ENCOUNTER — Other Ambulatory Visit: Payer: Self-pay

## 2019-03-27 VITALS — BP 120/66 | HR 92 | Ht 62.0 in | Wt 132.8 lb

## 2019-03-27 DIAGNOSIS — Z01818 Encounter for other preprocedural examination: Secondary | ICD-10-CM | POA: Diagnosis not present

## 2019-03-27 DIAGNOSIS — D250 Submucous leiomyoma of uterus: Secondary | ICD-10-CM

## 2019-03-27 DIAGNOSIS — N921 Excessive and frequent menstruation with irregular cycle: Secondary | ICD-10-CM

## 2019-03-27 MED ORDER — METRONIDAZOLE 500 MG PO TABS: 500.0000 mg | ORAL_TABLET | Freq: Two times a day (BID) | ORAL | 0 refills | Status: DC

## 2019-03-27 NOTE — H&P (View-Only) (Signed)
PRE-OPERATIVE HISTORY AND PHYSICAL EXAM  PCP:  Ria Bush, MD Subjective:   HPI:  Ashley Murray is a 43 y.o. [id number].  No LMP recorded.  She presents today for a pre-op discussion and PE.  She was considering multiple options including endometrial ablation fibroid embolization etc.  She has chosen definitive management with hysterectomy.  She is also considered oophorectomy but has decided upon keeping her ovaries at the time of surgery.  She has the following symptoms: Dysfunctional uterine bleeding, menorrhagia, uterine fibroids.  Review of Systems:   Constitutional: Denied constitutional symptoms, night sweats, recent illness, fatigue, fever, insomnia and weight loss.  Eyes: Denied eye symptoms, eye pain, photophobia, vision change and visual disturbance.  Ears/Nose/Throat/Neck: Denied ear, nose, throat or neck symptoms, hearing loss, nasal discharge, sinus congestion and sore throat.  Cardiovascular: Denied cardiovascular symptoms, arrhythmia, chest pain/pressure, edema, exercise intolerance, orthopnea and palpitations.  Respiratory: Denied pulmonary symptoms, asthma, pleuritic pain, productive sputum, cough, dyspnea and wheezing.  Gastrointestinal: Denied, gastro-esophageal reflux, melena, nausea and vomiting.  Genitourinary: See HPI for additional information.  Musculoskeletal: Denied musculoskeletal symptoms, stiffness, swelling, muscle weakness and myalgia.  Dermatologic: Denied dermatology symptoms, rash and scar.  Neurologic: Denied neurology symptoms, dizziness, headache, neck pain and syncope.  Psychiatric: Denied psychiatric symptoms, anxiety and depression.  Endocrine: Denied endocrine symptoms including hot flashes and night sweats.   OB History  Gravida Para Term Preterm AB Living  '2 2 2     2  ' SAB TAB Ectopic Multiple Live Births          2    # Outcome Date GA Lbr Len/2nd Weight Sex Delivery Anes PTL Lv  2 Term 2008     Vag-Spont   LIV  1 Term 2005      Vag-Spont   LIV    Past Medical History:  Diagnosis Date  . Anemia   . Depression   . HLD (hyperlipidemia)   . Tension headache     Past Surgical History:  Procedure Laterality Date  . OTHER SURGICAL HISTORY     Hospitalized [facility] 2009 for muscle problems, EMGs of both upper arms  neg 09/12/07-Dr Carlis Abbott, neuro abd, US WNL 04/2009  . WISDOM TOOTH EXTRACTION        SOCIAL HISTORY:  Social History   Tobacco Use  Smoking Status Never Smoker  Smokeless Tobacco Never Used   Social History   Substance and Sexual Activity  Alcohol Use Yes   Comment: 1-2 glasses of wine a week    Social History   Substance and Sexual Activity  Drug Use No    Family History  Problem Relation Age of Onset  . Cancer Mother 56       BRCA gene neg  . Coronary artery disease Paternal Grandfather        Bypass at older age  . Hyperlipidemia Father   . Kidney disease Father 86       kidney polyp s/p benign biopsy  . Cancer Cousin 60       kidney  . Cancer Paternal Aunt 40       kidney  . Diabetes Neg Hx   . Stroke Neg Hx   . Hypertension Neg Hx     ALLERGIES:  Patient has no known allergies.  MEDS:   Current Outpatient Medications on File Prior to Visit  Medication Sig Dispense Refill  . estrogens, conjugated, (PREMARIN) 0.625 MG tablet Take one pill daily 30 tablet 1  .  Multiple Vitamin (MULTIVITAMIN WITH MINERALS) TABS tablet Take 1 tablet by mouth daily.    . norethindrone (AYGESTIN) 5 MG tablet Take 1 tablet (5 mg total) by mouth 2 (two) times daily. As directed 120 tablet 0   No current facility-administered medications on file prior to visit.     No orders of the defined types were placed in this encounter.    Physical examination BP 120/66   Pulse 92   Ht '5\' 2"'  (1.575 m)   Wt 132 lb 12.8 oz (60.2 kg)   BMI 24.29 kg/m   General NAD, Conversant  HEENT Atraumatic; Op clear with mmm.  Normo-cephalic. Pupils reactive. Anicteric sclerae  Thyroid/Neck Smooth without  nodularity or enlargement. Normal ROM.  Neck Supple.  Skin No rashes, lesions or ulceration. Normal palpated skin turgor. No nodularity.  Breasts: No masses or discharge.  Symmetric.  No axillary adenopathy.  Lungs: Clear to auscultation.No rales or wheezes. Normal Respiratory effort, no retractions.  Heart: NSR  2/6 SEM. No periferal edema  Abdomen: Soft.  Non-tender.  No masses.  No HSM. No hernia  Extremities: Moves all appropriately.  Normal ROM for age. No lymphadenopathy.  Neuro: Oriented to PPT.  Normal mood. Normal affect.     Pelvic:   Vulva: Normal appearance.  No lesions.  Vagina: No lesions or abnormalities noted.  Support: Normal pelvic support.  Urethra No masses tenderness or scarring.  Meatus Normal size without lesions or prolapse.  Cervix: Normal ectropion.  No lesions.  Anus: Normal exam.  No lesions.  Perineum: Normal exam.  No lesions.        Bimanual   Uterus:  8 weeks size.  Non-tender.  Mobile.  AV.  Adnexae: No masses.  Non-tender to palpation.  Cul-de-sac: Negative for abnormality.   Assessment:   [id number] Patient Active Problem List   Diagnosis Date Noted  . Anemia 02/11/2019  . Symptomatic anemia 02/10/2019  . Family history of kidney cancer 10/07/2013  . Healthcare maintenance 01/19/2012  . Family history of breast cancer in mother 01/19/2012  . VARICOSE VEINS, LOWER EXTREMITIES 12/23/2008  . ALLERGIC RHINITIS 07/29/2008  . HEADACHE, TENSION 02/12/2008  . GAD (generalized anxiety disorder) 09/12/2007  . SCOLIOSIS, MILD 09/03/2007  . HYPERLIPIDEMIA 08/08/2007    1. Preop examination   2. Fibroids, submucosal   3. Menorrhagia with irregular cycle     Plan:   Orders: No orders of the defined types were placed in this encounter.    1.  LAVH

## 2019-03-27 NOTE — Progress Notes (Addendum)
PRE-OPERATIVE HISTORY AND PHYSICAL EXAM  PCP:  Ria Bush, MD Subjective:   HPI:  Ashley Murray is a 43 y.o. [id number].  No LMP recorded.  She presents today for a pre-op discussion and PE.  She was considering multiple options including endometrial ablation fibroid embolization etc.  She has chosen definitive management with hysterectomy.  She is also considered oophorectomy but has decided upon keeping her ovaries at the time of surgery.  She has the following symptoms: Dysfunctional uterine bleeding, menorrhagia, uterine fibroids.  Review of Systems:   Constitutional: Denied constitutional symptoms, night sweats, recent illness, fatigue, fever, insomnia and weight loss.  Eyes: Denied eye symptoms, eye pain, photophobia, vision change and visual disturbance.  Ears/Nose/Throat/Neck: Denied ear, nose, throat or neck symptoms, hearing loss, nasal discharge, sinus congestion and sore throat.  Cardiovascular: Denied cardiovascular symptoms, arrhythmia, chest pain/pressure, edema, exercise intolerance, orthopnea and palpitations.  Respiratory: Denied pulmonary symptoms, asthma, pleuritic pain, productive sputum, cough, dyspnea and wheezing.  Gastrointestinal: Denied, gastro-esophageal reflux, melena, nausea and vomiting.  Genitourinary: See HPI for additional information.  Musculoskeletal: Denied musculoskeletal symptoms, stiffness, swelling, muscle weakness and myalgia.  Dermatologic: Denied dermatology symptoms, rash and scar.  Neurologic: Denied neurology symptoms, dizziness, headache, neck pain and syncope.  Psychiatric: Denied psychiatric symptoms, anxiety and depression.  Endocrine: Denied endocrine symptoms including hot flashes and night sweats.   OB History  Gravida Para Term Preterm AB Living  '2 2 2     2  ' SAB TAB Ectopic Multiple Live Births          2    # Outcome Date GA Lbr Len/2nd Weight Sex Delivery Anes PTL Lv  2 Term 2008     Vag-Spont   LIV  1 Term 2005      Vag-Spont   LIV    Past Medical History:  Diagnosis Date  . Anemia   . Depression   . HLD (hyperlipidemia)   . Tension headache     Past Surgical History:  Procedure Laterality Date  . OTHER SURGICAL HISTORY     Hospitalized [facility] 2009 for muscle problems, EMGs of both upper arms  neg 09/12/07-Dr Carlis Abbott, neuro abd, US WNL 04/2009  . WISDOM TOOTH EXTRACTION        SOCIAL HISTORY:  Social History   Tobacco Use  Smoking Status Never Smoker  Smokeless Tobacco Never Used   Social History   Substance and Sexual Activity  Alcohol Use Yes   Comment: 1-2 glasses of wine a week    Social History   Substance and Sexual Activity  Drug Use No    Family History  Problem Relation Age of Onset  . Cancer Mother 2       BRCA gene neg  . Coronary artery disease Paternal Grandfather        Bypass at older age  . Hyperlipidemia Father   . Kidney disease Father 3       kidney polyp s/p benign biopsy  . Cancer Cousin 60       kidney  . Cancer Paternal Aunt 38       kidney  . Diabetes Neg Hx   . Stroke Neg Hx   . Hypertension Neg Hx     ALLERGIES:  Patient has no known allergies.  MEDS:   Current Outpatient Medications on File Prior to Visit  Medication Sig Dispense Refill  . estrogens, conjugated, (PREMARIN) 0.625 MG tablet Take one pill daily 30 tablet 1  .  Multiple Vitamin (MULTIVITAMIN WITH MINERALS) TABS tablet Take 1 tablet by mouth daily.    . norethindrone (AYGESTIN) 5 MG tablet Take 1 tablet (5 mg total) by mouth 2 (two) times daily. As directed 120 tablet 0   No current facility-administered medications on file prior to visit.     No orders of the defined types were placed in this encounter.    Physical examination BP 120/66   Pulse 92   Ht '5\' 2"'  (1.575 m)   Wt 132 lb 12.8 oz (60.2 kg)   BMI 24.29 kg/m   General NAD, Conversant  HEENT Atraumatic; Op clear with mmm.  Normo-cephalic. Pupils reactive. Anicteric sclerae  Thyroid/Neck Smooth without  nodularity or enlargement. Normal ROM.  Neck Supple.  Skin No rashes, lesions or ulceration. Normal palpated skin turgor. No nodularity.  Breasts: No masses or discharge.  Symmetric.  No axillary adenopathy.  Lungs: Clear to auscultation.No rales or wheezes. Normal Respiratory effort, no retractions.  Heart: NSR.  2/6 SEM heard. No periferal edema  Abdomen: Soft.  Non-tender.  No masses.  No HSM. No hernia  Extremities: Moves all appropriately.  Normal ROM for age. No lymphadenopathy.  Neuro: Oriented to PPT.  Normal mood. Normal affect.     Pelvic:   Vulva: Normal appearance.  No lesions.  Vagina: No lesions or abnormalities noted.  Support: Normal pelvic support.  Urethra No masses tenderness or scarring.  Meatus Normal size without lesions or prolapse.  Cervix: Normal ectropion.  No lesions.  Anus: Normal exam.  No lesions.  Perineum: Normal exam.  No lesions.        Bimanual   Uterus:  8 weeks size.  Non-tender.  Mobile.  AV.  Adnexae: No masses.  Non-tender to palpation.  Cul-de-sac: Negative for abnormality.   Assessment:   [id number] Patient Active Problem List   Diagnosis Date Noted  . Anemia 02/11/2019  . Symptomatic anemia 02/10/2019  . Family history of kidney cancer 10/07/2013  . Healthcare maintenance 01/19/2012  . Family history of breast cancer in mother 01/19/2012  . VARICOSE VEINS, LOWER EXTREMITIES 12/23/2008  . ALLERGIC RHINITIS 07/29/2008  . HEADACHE, TENSION 02/12/2008  . GAD (generalized anxiety disorder) 09/12/2007  . SCOLIOSIS, MILD 09/03/2007  . HYPERLIPIDEMIA 08/08/2007    1. Preop examination   2. Fibroids, submucosal   3. Menorrhagia with irregular cycle     Plan:   Orders: No orders of the defined types were placed in this encounter.    1.  LAVH  Pre-op discussions regarding Risks and Benefits of her scheduled surgery.  LAVH The procedure of Laparoscopic Assisted Vaginal Hysterectomy was described to the patient in detail.  We  reviewed the rationale for Hysterectomy and the patient was again informed of other nonsurgical management possibilities for her condition.  She has considered these other options, and desires a Hysterectomy.  We have reviewed the fact that Hysterectomy is permanent and that following the procedure she will not be able to become pregnant or bear children.  We have discussed the following risk factors specifically and the patient has also been informed that additional complications not mentioned may develop:  Damage to bowel, bladder, ureters or to other internal organs, bleeding, infection and the risk from anesthesia.  We have discussed the procedure itself in detail and she has an informed understanding of this surgery.  We have also discussed the recovery period in which physical and sexual activity will be restricted for a varying degree of time, often 3 -  6 weeks. The Laparoscopic Portion of Hysterectomy has also been reviewed with the patient.  She understands how the laparoscope facilitates the procedure.  We have discussed the abdominal incisions and punctures that will be used.  We have also reviewed the increased Operating Room time often accompanying LAVH.  The slightly increased risk of complications secondary to abdominal punctures, and use of laparoscopic instrumentation has also been discussed in detail.I have answered all of her questions and I believe the patient has an informed understanding of the procedure of Laparoscopic Assisted Vaginal Hysterectomy.  Oophorectomy The option of Oophorectomy has been discussed with the patient.  Detailed risk/benefits have been reviewed.  The risks discussed include, but are not limited to, hemorrhage, infection, damage to ureter or other internal organ, and Ovarian Remnant Syndrome.  The benefits include a significant decrease in the risk of Ovarian Cancer and in benign Ovarian disease.  The risk of Ovarian CA has been estimated at 1 in 14.  This is a  relatively small risk.  However, should Ovarian CA develop, it is often found late in the course of the disease.  We have also discussed the role of inheritance in the development of Ovarian disease.  Some women, who have close relatives with Ovarian CA, have a higher than 1 in 70 risk of Ovarian CA.  The benefits of Estrogen replacement therapy following Oophorectomy has been stressed.  If she is premenopausal, we have discussed the fact that this procedure will make her permanently sterile and that premature menopause will result if no ERT is begun.  I have answered all of her questions, and I believe that she has an adequate and informed understanding of the risks and benefits of Oophorectomy. Patient has declined oophorectomy and would like to keep both of her ovaries.  Finis Bud, M.D. 03/27/2019 8:46 AM

## 2019-03-27 NOTE — H&P (Addendum)
PRE-OPERATIVE HISTORY AND PHYSICAL EXAM  PCP:  Ria Bush, MD Subjective:   HPI:  Ashley Murray is a 43 y.o. [id number].  No LMP recorded.  She presents today for a pre-op discussion and PE.  She was considering multiple options including endometrial ablation fibroid embolization etc.  She has chosen definitive management with hysterectomy.  She is also considered oophorectomy but has decided upon keeping her ovaries at the time of surgery.  She has the following symptoms: Dysfunctional uterine bleeding, menorrhagia, uterine fibroids.  Review of Systems:   Constitutional: Denied constitutional symptoms, night sweats, recent illness, fatigue, fever, insomnia and weight loss.  Eyes: Denied eye symptoms, eye pain, photophobia, vision change and visual disturbance.  Ears/Nose/Throat/Neck: Denied ear, nose, throat or neck symptoms, hearing loss, nasal discharge, sinus congestion and sore throat.  Cardiovascular: Denied cardiovascular symptoms, arrhythmia, chest pain/pressure, edema, exercise intolerance, orthopnea and palpitations.  Respiratory: Denied pulmonary symptoms, asthma, pleuritic pain, productive sputum, cough, dyspnea and wheezing.  Gastrointestinal: Denied, gastro-esophageal reflux, melena, nausea and vomiting.  Genitourinary: See HPI for additional information.  Musculoskeletal: Denied musculoskeletal symptoms, stiffness, swelling, muscle weakness and myalgia.  Dermatologic: Denied dermatology symptoms, rash and scar.  Neurologic: Denied neurology symptoms, dizziness, headache, neck pain and syncope.  Psychiatric: Denied psychiatric symptoms, anxiety and depression.  Endocrine: Denied endocrine symptoms including hot flashes and night sweats.   OB History  Gravida Para Term Preterm AB Living  '2 2 2     2  ' SAB TAB Ectopic Multiple Live Births          2    # Outcome Date GA Lbr Len/2nd Weight Sex Delivery Anes PTL Lv  2 Term 2008     Vag-Spont   LIV  1 Term 2005      Vag-Spont   LIV    Past Medical History:  Diagnosis Date  . Anemia   . Depression   . HLD (hyperlipidemia)   . Tension headache     Past Surgical History:  Procedure Laterality Date  . OTHER SURGICAL HISTORY     Hospitalized [facility] 2009 for muscle problems, EMGs of both upper arms  neg 09/12/07-Dr Carlis Abbott, neuro abd, US WNL 04/2009  . WISDOM TOOTH EXTRACTION        SOCIAL HISTORY:  Social History   Tobacco Use  Smoking Status Never Smoker  Smokeless Tobacco Never Used   Social History   Substance and Sexual Activity  Alcohol Use Yes   Comment: 1-2 glasses of wine a week    Social History   Substance and Sexual Activity  Drug Use No    Family History  Problem Relation Age of Onset  . Cancer Mother 86       BRCA gene neg  . Coronary artery disease Paternal Grandfather        Bypass at older age  . Hyperlipidemia Father   . Kidney disease Father 74       kidney polyp s/p benign biopsy  . Cancer Cousin 60       kidney  . Cancer Paternal Aunt 57       kidney  . Diabetes Neg Hx   . Stroke Neg Hx   . Hypertension Neg Hx     ALLERGIES:  Patient has no known allergies.  MEDS:   Current Outpatient Medications on File Prior to Visit  Medication Sig Dispense Refill  . estrogens, conjugated, (PREMARIN) 0.625 MG tablet Take one pill daily 30 tablet 1  .  Multiple Vitamin (MULTIVITAMIN WITH MINERALS) TABS tablet Take 1 tablet by mouth daily.    . norethindrone (AYGESTIN) 5 MG tablet Take 1 tablet (5 mg total) by mouth 2 (two) times daily. As directed 120 tablet 0   No current facility-administered medications on file prior to visit.     No orders of the defined types were placed in this encounter.    Physical examination BP 120/66   Pulse 92   Ht '5\' 2"'  (1.575 m)   Wt 132 lb 12.8 oz (60.2 kg)   BMI 24.29 kg/m   General NAD, Conversant  HEENT Atraumatic; Op clear with mmm.  Normo-cephalic. Pupils reactive. Anicteric sclerae  Thyroid/Neck Smooth without  nodularity or enlargement. Normal ROM.  Neck Supple.  Skin No rashes, lesions or ulceration. Normal palpated skin turgor. No nodularity.  Breasts: No masses or discharge.  Symmetric.  No axillary adenopathy.  Lungs: Clear to auscultation.No rales or wheezes. Normal Respiratory effort, no retractions.  Heart: NSR  2/6 SEM. No periferal edema  Abdomen: Soft.  Non-tender.  No masses.  No HSM. No hernia  Extremities: Moves all appropriately.  Normal ROM for age. No lymphadenopathy.  Neuro: Oriented to PPT.  Normal mood. Normal affect.     Pelvic:   Vulva: Normal appearance.  No lesions.  Vagina: No lesions or abnormalities noted.  Support: Normal pelvic support.  Urethra No masses tenderness or scarring.  Meatus Normal size without lesions or prolapse.  Cervix: Normal ectropion.  No lesions.  Anus: Normal exam.  No lesions.  Perineum: Normal exam.  No lesions.        Bimanual   Uterus:  8 weeks size.  Non-tender.  Mobile.  AV.  Adnexae: No masses.  Non-tender to palpation.  Cul-de-sac: Negative for abnormality.   Assessment:   [id number] Patient Active Problem List   Diagnosis Date Noted  . Anemia 02/11/2019  . Symptomatic anemia 02/10/2019  . Family history of kidney cancer 10/07/2013  . Healthcare maintenance 01/19/2012  . Family history of breast cancer in mother 01/19/2012  . VARICOSE VEINS, LOWER EXTREMITIES 12/23/2008  . ALLERGIC RHINITIS 07/29/2008  . HEADACHE, TENSION 02/12/2008  . GAD (generalized anxiety disorder) 09/12/2007  . SCOLIOSIS, MILD 09/03/2007  . HYPERLIPIDEMIA 08/08/2007    1. Preop examination   2. Fibroids, submucosal   3. Menorrhagia with irregular cycle     Plan:   Orders: No orders of the defined types were placed in this encounter.    1.  LAVH

## 2019-03-27 NOTE — Progress Notes (Signed)
Patient comes in today to discuss when to have surgery.

## 2019-03-30 ENCOUNTER — Other Ambulatory Visit: Payer: Self-pay | Admitting: Obstetrics and Gynecology

## 2019-04-15 ENCOUNTER — Other Ambulatory Visit: Payer: Self-pay

## 2019-04-15 ENCOUNTER — Encounter
Admission: RE | Admit: 2019-04-15 | Discharge: 2019-04-15 | Disposition: A | Discharge status: Home / Self Care | Payer: 59 | Source: Ambulatory Visit | Attending: Obstetrics and Gynecology | Admitting: Obstetrics and Gynecology

## 2019-04-15 NOTE — Patient Instructions (Signed)
Your procedure is scheduled on: Monday April 21, 2019 Report to Day Surgery on the 2nd floor of the [facility]. To find out your arrival time, please call [phone] between 1PM - 3PM on: Friday April 18, 2019  REMEMBER: Instructions that are not followed completely may result in serious medical risk, up to and including death; or upon the discretion of your surgeon and anesthesiologist your surgery may need to be rescheduled.  Do not eat food after midnight the night before surgery.  No gum chewing, lozengers or hard candies.  You may however, drink CLEAR liquids up to 4:30 AM THEN DRINK CARBOHYDRATE ENSURE DRINK  Clear liquids include: - water  - apple juice without pulp - CLEAR gatorade - black coffee or tea (Do NOT add milk or creamers to the coffee or tea) Do NOT drink anything that is not on this list.  Type 1 and Type 2 diabetics should only drink water.    No Alcohol for 24 hours before or after surgery.  No Smoking including e-cigarettes for 24 hours prior to surgery.  No chewable tobacco products for at least 6 hours prior to surgery.  No nicotine patches on the day of surgery.  On the morning of surgery brush your teeth with toothpaste and water, you may rinse your mouth with mouthwash if you wish. Do not swallow any toothpaste or mouthwash.  Notify your doctor if there is any change in your medical condition (cold, fever, infection).  Do not wear jewelry, make-up, hairpins, clips or nail polish.  Do not wear lotions, powders, or perfumes.   Do not shave 48 hours prior to surgery.   Contacts and dentures may not be worn into surgery.  Do not bring valuables to the hospital, including drivers license, insurance or credit cards.  [facility] is not responsible for any belongings or valuables.   TAKE THESE MEDICATIONS THE MORNING OF SURGERY: NONE  Use CHG Soap  as directed on instruction sheet.   Stop Anti-inflammatories (NSAIDS) such as Advil,  Aleve, Ibuprofen, Motrin, Naproxen, Naprosyn and Aspirin based products such as Excedrin, Goodys Powder, BC Powder. (May take Tylenol or Acetaminophen if needed.)  Stop ANY OVER THE COUNTER supplements until after surgery. (May continue Vitamin D, Vitamin B, and multivitamin.)  Wear comfortable clothing (specific to your surgery type) to the hospital.  Plan for stool softeners for home use.  If you are being discharged the day of surgery, you will not be allowed to drive home. You will need a responsible adult to drive you home and stay with you that night.   If you are taking public transportation, you will need to have a responsible adult with you. Please confirm with your physician that it is acceptable to use public transportation.   Please call [phone] if you have any questions about these instructions.

## 2019-04-17 ENCOUNTER — Other Ambulatory Visit: Payer: Self-pay

## 2019-04-17 ENCOUNTER — Other Ambulatory Visit
Admission: RE | Admit: 2019-04-17 | Discharge: 2019-04-17 | Disposition: A | Discharge status: Home / Self Care | Payer: 59 | Source: Ambulatory Visit | Attending: Obstetrics and Gynecology | Admitting: Obstetrics and Gynecology

## 2019-04-17 DIAGNOSIS — D259 Leiomyoma of uterus, unspecified: Secondary | ICD-10-CM | POA: Diagnosis not present

## 2019-04-17 DIAGNOSIS — Z01812 Encounter for preprocedural laboratory examination: Secondary | ICD-10-CM | POA: Insufficient documentation

## 2019-04-17 DIAGNOSIS — Z20828 Contact with and (suspected) exposure to other viral communicable diseases: Secondary | ICD-10-CM | POA: Insufficient documentation

## 2019-04-17 LAB — TYPE AND SCREEN
ABO/RH(D): O POS
Antibody Screen: NEGATIVE
Extend sample reason: TRANSFUSED

## 2019-04-17 LAB — CBC
HCT: 24.4 % — ABNORMAL LOW (ref 36.0–46.0)
Hemoglobin: 6.5 g/dL — ABNORMAL LOW (ref 12.0–15.0)
MCH: 19.7 pg — ABNORMAL LOW (ref 26.0–34.0)
MCHC: 26.6 g/dL — ABNORMAL LOW (ref 30.0–36.0)
MCV: 73.9 fL — ABNORMAL LOW (ref 80.0–100.0)
Platelets: 481 10*3/uL — ABNORMAL HIGH (ref 150–400)
RBC: 3.3 MIL/uL — ABNORMAL LOW (ref 3.87–5.11)
RDW: 20.3 % — ABNORMAL HIGH (ref 11.5–15.5)
WBC: 5.3 10*3/uL (ref 4.0–10.5)
nRBC: 0 % (ref 0.0–0.2)

## 2019-04-17 LAB — SARS CORONAVIRUS 2 (TAT 6-24 HRS): SARS Coronavirus 2: NEGATIVE

## 2019-04-18 ENCOUNTER — Telehealth: Payer: Self-pay | Admitting: Surgical

## 2019-04-18 NOTE — Telephone Encounter (Signed)
LM for patient to return call to discuss surgery on Monday.  Per Dr. Amalia Hailey patient hemoglobin is 6.5. She has surgery scheduled for Monday morning. Dr. Amalia Hailey said that the patient will need [facility] to [facility] at 1 PM for direct admit. She will need to receive blood over night. They will recheck Monday morning to see if she is where she needs to be for surgery on Monday.  If she calls back please get Roselyn Reef to talk to her.

## 2019-04-18 NOTE — Addendum Note (Signed)
Addended by: Finis Bud on: 04/18/2019 10:38 AM   Modules accepted: Orders, SmartSet

## 2019-04-18 NOTE — Telephone Encounter (Signed)
Spoke with patient and she is going to be at the hospital on Sunday.

## 2019-04-20 ENCOUNTER — Other Ambulatory Visit: Payer: Self-pay

## 2019-04-20 ENCOUNTER — Observation Stay
Admission: AD | Admit: 2019-04-20 | Discharge: 2019-04-22 | Disposition: A | Discharge status: Home / Self Care | Payer: 59 | Attending: Obstetrics and Gynecology | Admitting: Obstetrics and Gynecology

## 2019-04-20 DIAGNOSIS — D250 Submucous leiomyoma of uterus: Secondary | ICD-10-CM | POA: Diagnosis present

## 2019-04-20 DIAGNOSIS — Z23 Encounter for immunization: Secondary | ICD-10-CM | POA: Insufficient documentation

## 2019-04-20 DIAGNOSIS — D500 Iron deficiency anemia secondary to blood loss (chronic): Secondary | ICD-10-CM | POA: Diagnosis not present

## 2019-04-20 DIAGNOSIS — N938 Other specified abnormal uterine and vaginal bleeding: Secondary | ICD-10-CM | POA: Diagnosis not present

## 2019-04-20 DIAGNOSIS — D219 Benign neoplasm of connective and other soft tissue, unspecified: Secondary | ICD-10-CM | POA: Diagnosis present

## 2019-04-20 DIAGNOSIS — N920 Excessive and frequent menstruation with regular cycle: Secondary | ICD-10-CM | POA: Diagnosis not present

## 2019-04-20 DIAGNOSIS — Z79890 Hormone replacement therapy (postmenopausal): Secondary | ICD-10-CM | POA: Diagnosis not present

## 2019-04-20 DIAGNOSIS — D649 Anemia, unspecified: Secondary | ICD-10-CM | POA: Diagnosis present

## 2019-04-20 HISTORY — DX: Benign neoplasm of connective and other soft tissue, unspecified: D21.9

## 2019-04-20 HISTORY — DX: Encounter for other specified aftercare: Z51.89

## 2019-04-20 MED ORDER — SODIUM CHLORIDE 0.9% IV SOLUTION: Freq: Once | INTRAVENOUS | Status: AC

## 2019-04-20 MED ORDER — LACTATED RINGERS IV SOLN
INTRAVENOUS | Status: DC
  Administered 2019-04-21: 01:00:00 via INTRAVENOUS

## 2019-04-20 MED ORDER — ACETAMINOPHEN 325 MG PO TABS: 650.0000 mg | ORAL_TABLET | Freq: Once | ORAL | Status: DC

## 2019-04-20 MED ORDER — ACETAMINOPHEN 325 MG PO TABS: 650.0000 mg | ORAL_TABLET | ORAL | Status: DC | PRN

## 2019-04-20 MED ORDER — SODIUM CHLORIDE 0.9% IV SOLUTION
Freq: Once | INTRAVENOUS | Status: AC
  Administered 2019-04-20: 19:00:00 via INTRAVENOUS

## 2019-04-20 MED ORDER — FAMOTIDINE 20 MG PO TABS
20.0000 mg | ORAL_TABLET | Freq: Once | ORAL | Status: AC
  Administered 2019-04-21: 06:00:00 20 mg via ORAL
  Filled 2019-04-20: qty 1

## 2019-04-20 MED ORDER — INFLUENZA VAC SPLIT QUAD 0.5 ML IM SUSY: 0.5000 mL | PREFILLED_SYRINGE | INTRAMUSCULAR | Status: DC

## 2019-04-20 MED ORDER — CEFAZOLIN SODIUM-DEXTROSE 2-4 GM/100ML-% IV SOLN
2.0000 g | Freq: Once | INTRAVENOUS | Status: AC
  Administered 2019-04-21: 2 g via INTRAVENOUS
  Filled 2019-04-20: qty 100

## 2019-04-20 NOTE — Progress Notes (Signed)
Pt admitted to mother-baby unit Rm 350 for observation and blood transfusion prior to surgery on 10/12. Admission completed, consents signed, IV placed to left forearm. Awaiting lab draw for type and screen, 4 units requested allocated per blood bank. Pt oriented to room, care plan reviewed, orders for npo after 12am. Pt aware. Will notify MD.

## 2019-04-21 ENCOUNTER — Observation Stay: Payer: 59 | Admitting: Certified Registered Nurse Anesthetist

## 2019-04-21 ENCOUNTER — Encounter
Admission: AD | Disposition: A | Discharge status: Home / Self Care | Payer: Self-pay | Source: Home / Self Care | Attending: Obstetrics and Gynecology

## 2019-04-21 ENCOUNTER — Encounter: Payer: Self-pay | Admitting: *Deleted

## 2019-04-21 DIAGNOSIS — N920 Excessive and frequent menstruation with regular cycle: Secondary | ICD-10-CM | POA: Diagnosis not present

## 2019-04-21 DIAGNOSIS — D250 Submucous leiomyoma of uterus: Secondary | ICD-10-CM | POA: Diagnosis not present

## 2019-04-21 DIAGNOSIS — D500 Iron deficiency anemia secondary to blood loss (chronic): Secondary | ICD-10-CM | POA: Diagnosis not present

## 2019-04-21 DIAGNOSIS — D219 Benign neoplasm of connective and other soft tissue, unspecified: Secondary | ICD-10-CM | POA: Diagnosis present

## 2019-04-21 HISTORY — PX: LAPAROSCOPIC ASSISTED VAGINAL HYSTERECTOMY: SHX5398

## 2019-04-21 LAB — PREPARE RBC (CROSSMATCH)

## 2019-04-21 LAB — HEMOGLOBIN AND HEMATOCRIT, BLOOD
HCT: 27.9 % — ABNORMAL LOW (ref 36.0–46.0)
Hemoglobin: 8.1 g/dL — ABNORMAL LOW (ref 12.0–15.0)

## 2019-04-21 LAB — PREGNANCY, URINE: Preg Test, Ur: NEGATIVE

## 2019-04-21 SURGERY — HYSTERECTOMY, VAGINAL, LAPAROSCOPY-ASSISTED
Anesthesia: General | Site: Abdomen

## 2019-04-21 MED ORDER — PROPOFOL 10 MG/ML IV BOLUS
INTRAVENOUS | Status: DC | PRN
  Administered 2019-04-21: 150 mg via INTRAVENOUS

## 2019-04-21 MED ORDER — ROCURONIUM BROMIDE 100 MG/10ML IV SOLN
INTRAVENOUS | Status: DC | PRN
  Administered 2019-04-21: 10 mg via INTRAVENOUS
  Administered 2019-04-21: 40 mg via INTRAVENOUS
  Administered 2019-04-21: 20 mg via INTRAVENOUS

## 2019-04-21 MED ORDER — EPHEDRINE SULFATE 50 MG/ML IJ SOLN
INTRAMUSCULAR | Status: AC
  Filled 2019-04-21: qty 1

## 2019-04-21 MED ORDER — FENTANYL CITRATE (PF) 100 MCG/2ML IJ SOLN
INTRAMUSCULAR | Status: DC | PRN
  Administered 2019-04-21 (×2): 50 ug via INTRAVENOUS
  Administered 2019-04-21: 25 ug via INTRAVENOUS
  Administered 2019-04-21: 50 ug via INTRAVENOUS

## 2019-04-21 MED ORDER — DEXAMETHASONE SODIUM PHOSPHATE 10 MG/ML IJ SOLN
INTRAMUSCULAR | Status: AC
  Filled 2019-04-21: qty 1

## 2019-04-21 MED ORDER — KETOROLAC TROMETHAMINE 30 MG/ML IJ SOLN
INTRAMUSCULAR | Status: DC | PRN
  Administered 2019-04-21: 30 mg via INTRAVENOUS

## 2019-04-21 MED ORDER — ONDANSETRON HCL 4 MG/2ML IJ SOLN
INTRAMUSCULAR | Status: DC | PRN
  Administered 2019-04-21: 4 mg via INTRAVENOUS

## 2019-04-21 MED ORDER — BUPIVACAINE HCL (PF) 0.5 % IJ SOLN
INTRAMUSCULAR | Status: AC
  Filled 2019-04-21: qty 30

## 2019-04-21 MED ORDER — ONDANSETRON HCL 4 MG/2ML IJ SOLN
INTRAMUSCULAR | Status: AC
  Filled 2019-04-21: qty 2

## 2019-04-21 MED ORDER — BUPIVACAINE HCL 0.5 % IJ SOLN
INTRAMUSCULAR | Status: DC | PRN
  Administered 2019-04-21: 10 mL

## 2019-04-21 MED ORDER — SODIUM CHLORIDE 0.9% IV SOLUTION
Freq: Once | INTRAVENOUS | Status: AC
  Administered 2019-04-21: 02:00:00 via INTRAVENOUS

## 2019-04-21 MED ORDER — PHENYLEPHRINE HCL (PRESSORS) 10 MG/ML IV SOLN
INTRAVENOUS | Status: AC
  Filled 2019-04-21: qty 1

## 2019-04-21 MED ORDER — PROPOFOL 10 MG/ML IV BOLUS
INTRAVENOUS | Status: AC
  Filled 2019-04-21: qty 20

## 2019-04-21 MED ORDER — VASOPRESSIN 20 UNIT/ML IV SOLN
INTRAVENOUS | Status: AC
  Filled 2019-04-21: qty 1

## 2019-04-21 MED ORDER — LIDOCAINE HCL (PF) 2 % IJ SOLN
INTRAMUSCULAR | Status: AC
  Filled 2019-04-21: qty 10

## 2019-04-21 MED ORDER — SUGAMMADEX SODIUM 200 MG/2ML IV SOLN
INTRAVENOUS | Status: DC | PRN
  Administered 2019-04-21: 200 mg via INTRAVENOUS

## 2019-04-21 MED ORDER — FENTANYL CITRATE (PF) 100 MCG/2ML IJ SOLN
INTRAMUSCULAR | Status: AC
  Filled 2019-04-21: qty 2

## 2019-04-21 MED ORDER — ROCURONIUM BROMIDE 50 MG/5ML IV SOLN
INTRAVENOUS | Status: AC
  Filled 2019-04-21: qty 1

## 2019-04-21 MED ORDER — SUCCINYLCHOLINE CHLORIDE 20 MG/ML IJ SOLN
INTRAMUSCULAR | Status: AC
  Filled 2019-04-21: qty 1

## 2019-04-21 MED ORDER — ACETAMINOPHEN 10 MG/ML IV SOLN
INTRAVENOUS | Status: DC | PRN
  Administered 2019-04-21: 1000 mg via INTRAVENOUS

## 2019-04-21 MED ORDER — MIDAZOLAM HCL 2 MG/2ML IJ SOLN
INTRAMUSCULAR | Status: DC | PRN
  Administered 2019-04-21: 2 mg via INTRAVENOUS

## 2019-04-21 MED ORDER — MIDAZOLAM HCL 2 MG/2ML IJ SOLN
INTRAMUSCULAR | Status: AC
  Filled 2019-04-21: qty 2

## 2019-04-21 MED ORDER — LIDOCAINE HCL (CARDIAC) PF 100 MG/5ML IV SOSY
PREFILLED_SYRINGE | INTRAVENOUS | Status: DC | PRN
  Administered 2019-04-21: 100 mg via INTRAVENOUS

## 2019-04-21 MED ORDER — GLYCOPYRROLATE 0.2 MG/ML IJ SOLN
INTRAMUSCULAR | Status: AC
  Filled 2019-04-21: qty 1

## 2019-04-21 MED ORDER — PHENYLEPHRINE HCL (PRESSORS) 10 MG/ML IV SOLN
INTRAVENOUS | Status: DC | PRN
  Administered 2019-04-21: 100 ug via INTRAVENOUS

## 2019-04-21 MED ORDER — KETOROLAC TROMETHAMINE 30 MG/ML IJ SOLN: 30.0000 mg | Freq: Once | INTRAMUSCULAR | Status: DC

## 2019-04-21 MED ORDER — VASOPRESSIN 20 UNIT/ML IV SOLN
INTRAVENOUS | Status: DC | PRN
  Administered 2019-04-21: 9 mL via INTRAMUSCULAR

## 2019-04-21 MED ORDER — ONDANSETRON HCL 4 MG/2ML IJ SOLN
4.0000 mg | Freq: Once | INTRAMUSCULAR | Status: AC | PRN
  Administered 2019-04-21: 4 mg via INTRAVENOUS

## 2019-04-21 MED ORDER — IBUPROFEN 800 MG PO TABS
800.0000 mg | ORAL_TABLET | Freq: Three times a day (TID) | ORAL | Status: DC
  Administered 2019-04-21 – 2019-04-22 (×3): 800 mg via ORAL
  Filled 2019-04-21 (×3): qty 1

## 2019-04-21 MED ORDER — SODIUM CHLORIDE (PF) 0.9 % IJ SOLN
INTRAMUSCULAR | Status: AC
  Filled 2019-04-21: qty 50

## 2019-04-21 MED ORDER — DEXTROSE IN LACTATED RINGERS 5 % IV SOLN: INTRAVENOUS | Status: DC

## 2019-04-21 MED ORDER — SIMETHICONE 80 MG PO CHEW
80.0000 mg | CHEWABLE_TABLET | Freq: Four times a day (QID) | ORAL | Status: DC | PRN
  Administered 2019-04-21: 11:00:00 80 mg via ORAL
  Filled 2019-04-21: qty 1

## 2019-04-21 MED ORDER — FENTANYL CITRATE (PF) 100 MCG/2ML IJ SOLN
INTRAMUSCULAR | Status: AC
  Administered 2019-04-21: 11:00:00 25 ug via INTRAVENOUS
  Filled 2019-04-21: qty 2

## 2019-04-21 MED ORDER — DEXAMETHASONE SODIUM PHOSPHATE 10 MG/ML IJ SOLN
INTRAMUSCULAR | Status: DC | PRN
  Administered 2019-04-21: 10 mg via INTRAVENOUS

## 2019-04-21 MED ORDER — LACTATED RINGERS IV SOLN
INTRAVENOUS | Status: DC | PRN
  Administered 2019-04-21: 08:00:00 via INTRAVENOUS

## 2019-04-21 MED ORDER — EPHEDRINE SULFATE 50 MG/ML IJ SOLN
INTRAMUSCULAR | Status: DC | PRN
  Administered 2019-04-21: 5 mg via INTRAVENOUS

## 2019-04-21 MED ORDER — FENTANYL CITRATE (PF) 100 MCG/2ML IJ SOLN
25.0000 ug | INTRAMUSCULAR | Status: DC | PRN
  Administered 2019-04-21 (×2): 25 ug via INTRAVENOUS

## 2019-04-21 MED ORDER — OXYCODONE-ACETAMINOPHEN 5-325 MG PO TABS
1.0000 | ORAL_TABLET | ORAL | Status: DC | PRN
  Administered 2019-04-21 (×2): 2 via ORAL
  Filled 2019-04-21 (×2): qty 2

## 2019-04-21 MED ORDER — ACETAMINOPHEN 10 MG/ML IV SOLN
INTRAVENOUS | Status: AC
  Filled 2019-04-21: qty 100

## 2019-04-21 SURGICAL SUPPLY — 49 items
ADH LQ OCL WTPRF AMP STRL LF (MISCELLANEOUS) ×1
ADH SKN CLS APL DERMABOND .7 (GAUZE/BANDAGES/DRESSINGS)
ADHESIVE MASTISOL STRL (MISCELLANEOUS) ×3 IMPLANT
APL PRP STRL LF DISP 70% ISPRP (MISCELLANEOUS) ×1
BAG URINE DRAINAGE (UROLOGICAL SUPPLIES) ×3 IMPLANT
BLADE SURG SZ11 CARB STEEL (BLADE) ×3 IMPLANT
CATH FOLEY 2WAY  5CC 16FR (CATHETERS) ×2
CATH FOLEY 2WAY 5CC 16FR (CATHETERS) ×1
CATH URTH 16FR FL 2W BLN LF (CATHETERS) ×1 IMPLANT
CHLORAPREP W/TINT 26 (MISCELLANEOUS) ×3 IMPLANT
CLOSURE WOUND 1/2 X4 (GAUZE/BANDAGES/DRESSINGS) ×1
COVER WAND RF STERILE (DRAPES) IMPLANT
DEFOGGER SCOPE WARMER CLEARIFY (MISCELLANEOUS) ×3 IMPLANT
DERMABOND ADVANCED (GAUZE/BANDAGES/DRESSINGS)
DERMABOND ADVANCED .7 DNX12 (GAUZE/BANDAGES/DRESSINGS) ×1 IMPLANT
ELECT REM PT RETURN 9FT ADLT (ELECTROSURGICAL) ×3
ELECTRODE REM PT RTRN 9FT ADLT (ELECTROSURGICAL) ×1 IMPLANT
GLOVE BIOGEL PI ORTHO PRO 7.5 (GLOVE) ×4
GLOVE PI ORTHO PRO STRL 7.5 (GLOVE) ×1 IMPLANT
GLOVE SURG SYN 8.0 (GLOVE) ×6 IMPLANT
GLOVE SURG SYN 8.0 PF PI (GLOVE) ×2 IMPLANT
GOWN STRL REUS W/ TWL LRG LVL3 (GOWN DISPOSABLE) ×1 IMPLANT
GOWN STRL REUS W/ TWL XL LVL3 (GOWN DISPOSABLE) ×2 IMPLANT
GOWN STRL REUS W/TWL LRG LVL3 (GOWN DISPOSABLE) ×9
GOWN STRL REUS W/TWL XL LVL3 (GOWN DISPOSABLE) ×3
HANDLE YANKAUER SUCT BULB TIP (MISCELLANEOUS) ×3 IMPLANT
IRRIGATION STRYKERFLOW (MISCELLANEOUS) IMPLANT
IRRIGATOR STRYKERFLOW (MISCELLANEOUS)
IV LACTATED RINGERS 1000ML (IV SOLUTION) ×1 IMPLANT
KIT PINK PAD W/HEAD ARE REST (MISCELLANEOUS) ×3
KIT PINK PAD W/HEAD ARM REST (MISCELLANEOUS) ×1 IMPLANT
KIT TURNOVER CYSTO (KITS) ×3 IMPLANT
LIGASURE LAP MARYLAND 5MM 37CM (ELECTROSURGICAL) ×2 IMPLANT
PACK BASIN MINOR ARMC (MISCELLANEOUS) ×3 IMPLANT
PACK GYN LAPAROSCOPIC (MISCELLANEOUS) ×3 IMPLANT
PAD OB MATERNITY 4.3X12.25 (PERSONAL CARE ITEMS) ×3 IMPLANT
PAD PREP 24X41 OB/GYN DISP (PERSONAL CARE ITEMS) ×3 IMPLANT
SLEEVE ENDOPATH XCEL 5M (ENDOMECHANICALS) ×6 IMPLANT
SOLUTION ELECTROLUBE (MISCELLANEOUS) ×3 IMPLANT
SPONGE LAP 18X18 RF (DISPOSABLE) ×3 IMPLANT
STRIP CLOSURE SKIN 1/2X4 (GAUZE/BANDAGES/DRESSINGS) ×1 IMPLANT
SUT VIC AB 0 CT1 27 (SUTURE) ×6
SUT VIC AB 0 CT1 27XCR 8 STRN (SUTURE) ×2 IMPLANT
SUT VIC AB 0 CT1 36 (SUTURE) ×3 IMPLANT
SUT VIC AB 4-0 FS2 27 (SUTURE) ×3 IMPLANT
SYR 10ML LL (SYRINGE) ×3 IMPLANT
TAPE TRANSPORE STRL 2 31045 (GAUZE/BANDAGES/DRESSINGS) ×1 IMPLANT
TROCAR XCEL NON-BLD 5MMX100MML (ENDOMECHANICALS) ×3 IMPLANT
TUBING EVAC SMOKE HEATED PNEUM (TUBING) ×3 IMPLANT

## 2019-04-21 NOTE — Transfer of Care (Signed)
Immediate Anesthesia Transfer of Care Note  Patient: Ashley Murray  Procedure(s) Performed: LAPAROSCOPIC ASSISTED VAGINAL HYSTERECTOMY (N/A Abdomen)  Patient Location: PACU  Anesthesia Type:General  Level of Consciousness: awake and drowsy  Airway & Oxygen Therapy: Patient Spontanous Breathing and Patient connected to face mask oxygen  Post-op Assessment: Report given to RN and Post -op Vital signs reviewed and stable  Post vital signs: Reviewed and stable  Last Vitals:  Vitals Value Taken Time  BP 114/57 04/21/19 0957  Temp 36.4 C 04/21/19 0957  Pulse 65 04/21/19 1000  Resp 17 04/21/19 1000  SpO2 100 % 04/21/19 1000  Vitals shown include unvalidated device data.  Last Pain:  Vitals:   04/21/19 0957  TempSrc:   PainSc: Asleep         Complications: No apparent anesthesia complications

## 2019-04-21 NOTE — Op Note (Addendum)
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   ANESTHESIA: General  ESTIMATED BLOOD LOSS: 200 mL  OPERATIVE FINDINGS: Enlarged boggy uterus with fibroids.  Normal ovaries  SPECIMEN:  ID Type Source Tests Collected by Time Destination  1 : uterus with cervix, bilateral tubes Tissue PATH Other SURGICAL PATHOLOGY Harlin Heys, MD [id number]     COMPLICATIONS: None  DRAINS: Foley to gravity  DISPOSITION: Stable to recovery room  DESCRIPTION OF PROCEDURE:      The patient was prepped and draped in the dorsolithotomy position and placed under general anesthesia. The bladder was emptied. The cervix was grasped with a multi-toothed tenaculum and a uterine manipulator was placed within the cervical os respecting the position and curvature of the uterus. After changing gloves we proceeded abdominally. A small infraumbilical incision was made and a 5 mm trocar port was placed within the abdominopelvic cavity. The opening pressure was less than 7 mmHg.  Approximately 3 and 1/2 L of carbon dioxide gas was instilled within the abdominal pelvic cavity. The laparoscope was placed and the pelvis and abdomen were carefully inspected. In the usual manner, under direct visualization right and left lower quadrant ports of 5 mm size were placed. Both ureters were identified in the pelvis prior to dissection or clamping and cutting of pedicles.  The fallopian tubes were elevated and the mesenteric side systematically coagulated and divided allowing the tube to be removed  at the time of uterine removal. The round ligaments were coagulated and divided and a bladder flap was created. The upper aspect of the broad ligament was clamped coagulated and divided. The uterine arteries were skeletonized, triply coagulated and divided. Careful inspection of all pedicles and the remainder of the pelvis was performed. Hemostasis was noted. The lower quadrant ports were removed, hemostasis of the port sites was noted, and the incisions were closed in subcuticular manner. The laparoscope and trocar sleeve were removed from the infraumbilical incision, hemostasis was noted, and the incision was closed in a subcuticular manner. A long-acting anesthetic was employed in the skin incisions. We then proceeded vaginally. A weighted speculum was placed posteriorly. A multi-toothed tenaculum was used to grasp the cervix and the cervix was injected in a circumferential manner with a dilute Pitressin solution. An incision was made around the cervix and the vaginal mucosa was dissected off of the cervix. The posterior cul-de-sac was identified and entered and the weighted speculum was placed within this. The anterior cul-de-sac was identified and entered and a retractor was placed and used to retract the bladder anteriorly keeping it out of the operative field. The uterosacral ligaments were clamped divided and suture ligated. The cardinal ligaments were clamped divided and suture ligated. The small remaining pedicle was clamped divided and suture ligated bilaterally allowing delivery of the specimen. Angle sutures were placed in the manner. A culdoplasty was performed. The peritoneum was identified anteriorly and then incorporating the left upper pedicle left lower pedicle right lower pedicle right upper pedicle and anterior peritoneum a pursestring suture was placed exteriorizing all pedicles. Hemostasis of all pedicles was noted at  this time. The vaginal mucosa was then closed with a running suture of  Vicryl.  The patient went to recovery room in stable condition. Clear urine was noted in the Foley at the conclusion of the procedure.  Finis Bud, M.D. 04/21/2019 10:28 AM

## 2019-04-21 NOTE — Anesthesia Procedure Notes (Signed)
Procedure Name: Intubation Date/Time: 04/21/2019 7:38 AM Performed by: Johnna Acosta, CRNA Pre-anesthesia Checklist: Patient identified, Emergency Drugs available, Suction available, Patient being monitored and Timeout performed Patient Re-evaluated:Patient Re-evaluated prior to induction Oxygen Delivery Method: Circle system utilized Preoxygenation: Pre-oxygenation with 100% oxygen Induction Type: IV induction Ventilation: Mask ventilation without difficulty Laryngoscope Size: Miller and 2 Grade View: Grade II Tube type: Oral Tube size: 7.0 mm Number of attempts: 1 Airway Equipment and Method: Stylet Placement Confirmation: ETT inserted through vocal cords under direct vision,  positive ETCO2 and breath sounds checked- equal and bilateral Secured at: 20 cm Tube secured with: Tape Dental Injury: Teeth and Oropharynx as per pre-operative assessment

## 2019-04-21 NOTE — Anesthesia Preprocedure Evaluation (Signed)
Anesthesia Evaluation  Patient identified by MRN, date of birth, ID band Patient awake    Reviewed: Allergy & Precautions, NPO status , Patient's Chart, lab work & pertinent test results  Airway Mallampati: II  TM Distance: >3 FB     Dental  (+) Chipped   Pulmonary    Pulmonary exam normal        Cardiovascular negative cardio ROS Normal cardiovascular exam     Neuro/Psych  Headaches, PSYCHIATRIC DISORDERS Anxiety Depression    GI/Hepatic negative GI ROS, Neg liver ROS,   Endo/Other  negative endocrine ROS  Renal/GU negative Renal ROS  Female GU complaint     Musculoskeletal   Abdominal Normal abdominal exam  (+)   Peds negative pediatric ROS (+)  Hematology  (+) anemia ,   Anesthesia Other Findings Past Medical History: No date: Anemia No date: Blood transfusion without reported diagnosis No date: Depression No date: Fibroid No date: HLD (hyperlipidemia) No date: Tension headache  Reproductive/Obstetrics                             Anesthesia Physical Anesthesia Plan  ASA: II  Anesthesia Plan: General   Post-op Pain Management:    Induction: Intravenous  PONV Risk Score and Plan:   Airway Management Planned: Oral ETT  Additional Equipment:   Intra-op Plan:   Post-operative Plan: Extubation in OR  Informed Consent: I have reviewed the patients History and Physical, chart, labs and discussed the procedure including the risks, benefits and alternatives for the proposed anesthesia with the patient or authorized representative who has indicated his/her understanding and acceptance.     Dental advisory given  Plan Discussed with: CRNA and Surgeon  Anesthesia Plan Comments:         Anesthesia Quick Evaluation

## 2019-04-21 NOTE — Anesthesia Post-op Follow-up Note (Signed)
Anesthesia QCDR form completed.        

## 2019-04-21 NOTE — Progress Notes (Signed)
Report given to OR nurse Mariel Kansky, RN. All preop meds given, chg wipes done pt voided and consents signed and on the chart. VS wnl and no needs or concerns expressed at this time

## 2019-04-21 NOTE — Interval H&P Note (Signed)
History and Physical Interval Note:  Pt admitted yesterday for transfusion of 3uPRBCs because of low Hgb.  Have been unable to hormonally prevent bleeding as outpatient.   04/21/2019 7:30 AM  Ashley Murray  has presented today for surgery, with the diagnosis of FIBROIDS, MENORRHAGIA.  The various methods of treatment have been discussed with the patient and family. After consideration of risks, benefits and other options for treatment, the patient has consented to  Procedure(s): LAPAROSCOPIC ASSISTED VAGINAL HYSTERECTOMY (N/A) as a surgical intervention.  The patient's history has been reviewed, patient examined, no change in status, stable for surgery.  I have reviewed the patient's chart and labs.  Questions were answered to the patient's satisfaction.     Jeannie Fend

## 2019-04-22 ENCOUNTER — Other Ambulatory Visit: Payer: Self-pay | Admitting: Obstetrics and Gynecology

## 2019-04-22 ENCOUNTER — Encounter: Payer: Self-pay | Admitting: Obstetrics and Gynecology

## 2019-04-22 DIAGNOSIS — D250 Submucous leiomyoma of uterus: Secondary | ICD-10-CM | POA: Diagnosis not present

## 2019-04-22 LAB — BPAM RBC
Blood Product Expiration Date: 202011122359
Blood Product Expiration Date: 202011122359
Blood Product Expiration Date: 202011122359
Blood Product Expiration Date: 202011152359
ISSUE DATE / TIME: 202010111651
ISSUE DATE / TIME: 202010111910
ISSUE DATE / TIME: 202010120203
Unit Type and Rh: 5100
Unit Type and Rh: 5100
Unit Type and Rh: 5100
Unit Type and Rh: 5100

## 2019-04-22 LAB — TYPE AND SCREEN
ABO/RH(D): O POS
Antibody Screen: NEGATIVE
Unit division: 0
Unit division: 0
Unit division: 0
Unit division: 0

## 2019-04-22 LAB — SURGICAL PATHOLOGY

## 2019-04-22 MED ORDER — INFLUENZA VAC SPLIT QUAD 0.5 ML IM SUSY
0.5000 mL | PREFILLED_SYRINGE | INTRAMUSCULAR | Status: AC
  Administered 2019-04-22: 0.5 mL via INTRAMUSCULAR
  Filled 2019-04-22: qty 0.5

## 2019-04-22 MED ORDER — INFLUENZA VAC A&B SA ADJ QUAD 0.5 ML IM PRSY: 0.5000 mL | PREFILLED_SYRINGE | INTRAMUSCULAR | Status: DC

## 2019-04-22 MED ORDER — INFLUENZA VAC SPLIT QUAD 0.5 ML IM SUSY: 0.5000 mL | PREFILLED_SYRINGE | INTRAMUSCULAR | Status: DC

## 2019-04-22 NOTE — Discharge Instructions (Signed)
Laparoscopically Assisted Vaginal Hysterectomy, Care After °This sheet gives you information about how to care for yourself after your procedure. Your health care provider may also give you more specific instructions. If you have problems or questions, contact your health care provider. °What can I expect after the procedure? °After the procedure, it is common to have: °· Soreness and numbness in your incision areas. °· Abdominal pain. You will be given pain medicine to control it. °· Vaginal bleeding and discharge. You will need to use a sanitary napkin after this procedure. °· Sore throat from the breathing tube that was inserted during surgery. °Follow these instructions at home: °Medicines °· Take over-the-counter and prescription medicines only as told by your health care provider. °· Do not take aspirin or ibuprofen. These medicines can cause bleeding. °· Do not drive or use heavy machinery while taking prescription pain medicine. °· Do not drive for 24 hours if you were given a medicine to help you relax (sedative) during the procedure. °Incision care ° °· Follow instructions from your health care provider about how to take care of your incisions. Make sure you: °? Wash your hands with soap and water before you change your bandage (dressing). If soap and water are not available, use hand sanitizer. °? Change your dressing as told by your health care provider. °? Leave stitches (sutures), skin glue, or adhesive strips in place. These skin closures may need to stay in place for 2 weeks or longer. If adhesive strip edges start to loosen and curl up, you may trim the loose edges. Do not remove adhesive strips completely unless your health care provider tells you to do that. °· Check your incision area every day for signs of infection. Check for: °? Redness, swelling, or pain. °? Fluid or blood. °? Warmth. °? Pus or a bad smell. °Activity °· Get regular exercise as told by your health care provider. You may be  told to take short walks every day and go farther each time. °· Return to your normal activities as told by your health care provider. Ask your health care provider what activities are safe for you. °· Do not douche, use tampons, or have sexual intercourse for at least 6 weeks, or until your health care provider gives you permission. °· Do not lift anything that is heavier than 10 lb (4.5 kg), or the limit that your health care provider tells you, until he or she says that it is safe. °General instructions °· Do not take baths, swim, or use a hot tub until your health care provider approves. Take showers instead of baths. °· Do not drive for 24 hours if you received a sedative. °· Do not drive or operate heavy machinery while taking prescription pain medicine. °· To prevent or treat constipation while you are taking prescription pain medicine, your health care provider may recommend that you: °? Drink enough fluid to keep your urine clear or pale yellow. °? Take over-the-counter or prescription medicines. °? Eat foods that are high in fiber, such as fresh fruits and vegetables, whole grains, and beans. °? Limit foods that are high in fat and processed sugars, such as fried and sweet foods. °· Keep all follow-up visits as told by your health care provider. This is important. °Contact a health care provider if: °· You have signs of infection, such as: °? Redness, swelling, or pain around your incision sites. °? Fluid or blood coming from an incision. °? An incision that feels warm to the   touch. °? Pus or a bad smell coming from an incision. °· Your incision breaks open. °· Your pain medicine is not helping. °· You feel dizzy or light-headed. °· You have pain or bleeding when you urinate. °· You have persistent nausea and vomiting. °· You have blood, pus, or a bad-smelling discharge from your vagina. °Get help right away if: °· You have a fever. °· You have severe abdominal pain. °· You have chest pain. °· You have  shortness of breath. °· You faint. °· You have pain, swelling, or redness in your leg. °· You have heavy bleeding from your vagina. °Summary °· After the procedure, it is common to have abdominal pain and vaginal bleeding. °· You should not drive or lift heavy objects until your health care provider says that it is safe. °· Contact your health care provider if you have any symptoms of infection, excessive vaginal bleeding, nausea, vomiting, or shortness of breath. °This information is not intended to replace advice given to you by your health care provider. Make sure you discuss any questions you have with your health care provider. °Document Released: 06/15/2011 Document Revised: 06/08/2017 Document Reviewed: 08/22/2016 °Elsevier Patient Education © 2020 [employer] ° °

## 2019-04-22 NOTE — Discharge Summary (Signed)
    Discharge Summary  Admit date: 04/20/2019  Discharge Date and Time:04/22/2019  10:56 AM  Discharge to:  Home  Admission Diagnosis: Present on Admission: . Anemia . Fibroids                     Discharge  Diagnoses: Active Problems:   Anemia   Fibroids   OR Procedures:   Procedure(s): LAPAROSCOPIC ASSISTED VAGINAL HYSTERECTOMY Date -------------------                              Discharge Day Progress Note:   Subjective:   The patient does not have complaints.  She is ambulating well. She is taking PO well. Her pain is well controlled with her current medications. She is urinating without difficulty and is passing flatus.   Objective:  BP 119/63 (BP Location: Left Arm)   Pulse 71   Temp 98.5 F (36.9 C) (Oral)   Resp 18   Ht 5\' 2"  (1.575 m)   Wt 61.7 kg   SpO2 99%   BMI 24.87 kg/m     Abdomen:                         clean, dry, no drainage    Assessment:   Doing well.  Normal progress as expected.  Summary - Pt admitted one day prior to surgery for transfusion of 3 U pRBCs because of her severe pre-op anemia.  Surgery went well without issue.  Pt has done very well post-op with no evidence of anemia.     Plan:        Discharge home.                       Medications as directed.  Hospital Course:  No notes on file   Condition at Discharge:  good    Follow Up:   Follow-up Information    Harlin Heys, MD Follow up in 1 week(s).   Specialties: Obstetrics and Gynecology, Radiology Contact information: [address] [place] [address] [phone]           Finis Bud, M.D. 04/22/2019 10:56 AM

## 2019-04-22 NOTE — Anesthesia Postprocedure Evaluation (Signed)
Anesthesia Post Note  Patient: Ashley Murray  Procedure(s) Performed: LAPAROSCOPIC ASSISTED VAGINAL HYSTERECTOMY (N/A Abdomen)  Patient location during evaluation: PACU Anesthesia Type: General Level of consciousness: awake and alert and oriented Pain management: pain level controlled Vital Signs Assessment: post-procedure vital signs reviewed and stable Respiratory status: spontaneous breathing Cardiovascular status: blood pressure returned to baseline Anesthetic complications: no     Last Vitals:  Vitals:   04/21/19 2335 04/22/19 0744  BP: (!) 103/58 119/63  Pulse: 67 71  Resp: 20 18  Temp: 37 C 36.9 C  SpO2: 99% 99%    Last Pain:  Vitals:   04/22/19 0744  TempSrc: Oral  PainSc:                  [name]

## 2019-04-22 NOTE — Progress Notes (Signed)
Discharge instructions reviewed with patient and patient verbalized understanding. Patient healthy and discharged to home. All documentation is complete.

## 2019-05-01 ENCOUNTER — Encounter: Payer: Self-pay | Admitting: Obstetrics and Gynecology

## 2019-05-01 ENCOUNTER — Other Ambulatory Visit: Payer: Self-pay

## 2019-05-01 ENCOUNTER — Ambulatory Visit (INDEPENDENT_AMBULATORY_CARE_PROVIDER_SITE_OTHER): Payer: 59 | Admitting: Obstetrics and Gynecology

## 2019-05-01 VITALS — BP 121/66 | HR 75 | Wt 129.2 lb

## 2019-05-01 DIAGNOSIS — Z9889 Other specified postprocedural states: Secondary | ICD-10-CM

## 2019-05-01 NOTE — Progress Notes (Signed)
Patient comes in today for 1 week post op. Patient states that she is doing great.

## 2019-05-01 NOTE — Progress Notes (Signed)
HPI:      [name] is a 43 y.o. [id number] who LMP was No LMP recorded.  Subjective:   She presents today approximately a week and a half from [facility].  She reports she is doing well and has returned to work.  Has no complaints ambulating voiding eating without difficulty.  Denies pain.    Hx: The following portions of the patient's history were reviewed and updated as appropriate:             She  has a past medical history of Anemia, Blood transfusion without reported diagnosis, Depression, Fibroid, HLD (hyperlipidemia), and Tension headache. She does not have any pertinent problems on file. She  has a past surgical history that includes Wisdom tooth extraction; Other surgical history; and Laparoscopic assisted vaginal hysterectomy (N/A, 04/21/2019). Her family history includes Cancer (age of onset: 6) in her mother; Cancer (age of onset: 39) in her cousin and paternal aunt; Coronary artery disease in her paternal grandfather; Hyperlipidemia in her father; Kidney disease (age of onset: 73) in her father. She  reports that she has never smoked. She has never used smokeless tobacco. She reports current alcohol use. She reports that she does not use drugs. She has a current medication list which includes the following prescription(s): estrogens (conjugated) and multivitamin with minerals. She has No Known Allergies.       Review of Systems:  Review of Systems  Constitutional: Denied constitutional symptoms, night sweats, recent illness, fatigue, fever, insomnia and weight loss.  Eyes: Denied eye symptoms, eye pain, photophobia, vision change and visual disturbance.  Ears/Nose/Throat/Neck: Denied ear, nose, throat or neck symptoms, hearing loss, nasal discharge, sinus congestion and sore throat.  Cardiovascular: Denied cardiovascular symptoms, arrhythmia, chest pain/pressure, edema, exercise intolerance, orthopnea and palpitations.  Respiratory: Denied pulmonary symptoms, asthma, pleuritic  pain, productive sputum, cough, dyspnea and wheezing.  Gastrointestinal: Denied, gastro-esophageal reflux, melena, nausea and vomiting.  Genitourinary: Denied genitourinary symptoms including symptomatic vaginal discharge, pelvic relaxation issues, and urinary complaints.  Musculoskeletal: Denied musculoskeletal symptoms, stiffness, swelling, muscle weakness and myalgia.  Dermatologic: Denied dermatology symptoms, rash and scar.  Neurologic: Denied neurology symptoms, dizziness, headache, neck pain and syncope.  Psychiatric: Denied psychiatric symptoms, anxiety and depression.  Endocrine: Denied endocrine symptoms including hot flashes and night sweats.   Meds:   Current Outpatient Medications on File Prior to Visit  Medication Sig Dispense Refill  . estrogens, conjugated, (PREMARIN) 0.625 MG tablet TAKE 1 TABLET BY MOUTH EVERY DAY 90 tablet 1  . Multiple Vitamin (MULTIVITAMIN WITH MINERALS) TABS tablet Take 1 tablet by mouth daily.     No current facility-administered medications on file prior to visit.     Objective:     Vitals:   05/01/19 1135  BP: 121/66  Pulse: 75               Abdomen: Soft.  Non-tender.  No masses.  No HSM.  Incision/s: Intact.  Healing well.  No erythema.  No drainage.      Assessment:    [id number] Patient Active Problem List   Diagnosis Date Noted  . Fibroids 04/21/2019  . Anemia 02/11/2019  . Symptomatic anemia 02/10/2019  . Family history of kidney cancer 10/07/2013  . Healthcare maintenance 01/19/2012  . Family history of breast cancer in mother 01/19/2012  . VARICOSE VEINS, LOWER EXTREMITIES 12/23/2008  . ALLERGIC RHINITIS 07/29/2008  . HEADACHE, TENSION 02/12/2008  . GAD (generalized anxiety disorder) 09/12/2007  . SCOLIOSIS, MILD 09/03/2007  . HYPERLIPIDEMIA  08/08/2007     1. Post-operative state     Patient with excellent recovery.   Plan:            1.  Patient may resume normal activities with exception of heavy lifting  and intercourse. Orders No orders of the defined types were placed in this encounter.   No orders of the defined types were placed in this encounter.     F/U  Return in about 5 weeks (around 06/05/2019).  Finis Bud, M.D. 05/01/2019 12:32 PM

## 2019-06-03 ENCOUNTER — Encounter: Payer: 59 | Admitting: Obstetrics and Gynecology

## 2019-06-18 ENCOUNTER — Ambulatory Visit (INDEPENDENT_AMBULATORY_CARE_PROVIDER_SITE_OTHER): Payer: 59 | Admitting: Obstetrics and Gynecology

## 2019-06-18 ENCOUNTER — Other Ambulatory Visit: Payer: Self-pay

## 2019-06-18 ENCOUNTER — Encounter: Payer: Self-pay | Admitting: Obstetrics and Gynecology

## 2019-06-18 VITALS — BP 136/72 | HR 66 | Ht 62.0 in | Wt 131.8 lb

## 2019-06-18 DIAGNOSIS — Z9889 Other specified postprocedural states: Secondary | ICD-10-CM

## 2019-06-18 NOTE — Progress Notes (Signed)
Patient comes in today for post op appointment. She has no concerns.

## 2019-06-18 NOTE — Progress Notes (Signed)
HPI:      [name] is a 43 y.o. [id number] who LMP was Patient's last menstrual period was 02/10/2019.  Subjective:   She presents today 5 weeks postop.  She had returned to work and is doing well.  She has no complaints.  She has no pain no vaginal bleeding no difficulty with eating bowel movements or urination.  She feels very happy about having had her surgery.    Hx: The following portions of the patient's history were reviewed and updated as appropriate:             She  has a past medical history of Anemia, Blood transfusion without reported diagnosis, Depression, Fibroid, HLD (hyperlipidemia), and Tension headache. She does not have any pertinent problems on file. She  has a past surgical history that includes Wisdom tooth extraction; Other surgical history; and Laparoscopic assisted vaginal hysterectomy (N/A, 04/21/2019). Her family history includes Cancer (age of onset: 81) in her mother; Cancer (age of onset: 103) in her cousin and paternal aunt; Coronary artery disease in her paternal grandfather; Hyperlipidemia in her father; Kidney disease (age of onset: 47) in her father. She  reports that she has never smoked. She has never used smokeless tobacco. She reports current alcohol use. She reports that she does not use drugs. She has a current medication list which includes the following prescription(s): estrogens (conjugated) and multivitamin with minerals. She has No Known Allergies.       Review of Systems:  Review of Systems  Constitutional: Denied constitutional symptoms, night sweats, recent illness, fatigue, fever, insomnia and weight loss.  Eyes: Denied eye symptoms, eye pain, photophobia, vision change and visual disturbance.  Ears/Nose/Throat/Neck: Denied ear, nose, throat or neck symptoms, hearing loss, nasal discharge, sinus congestion and sore throat.  Cardiovascular: Denied cardiovascular symptoms, arrhythmia, chest pain/pressure, edema, exercise intolerance,  orthopnea and palpitations.  Respiratory: Denied pulmonary symptoms, asthma, pleuritic pain, productive sputum, cough, dyspnea and wheezing.  Gastrointestinal: Denied, gastro-esophageal reflux, melena, nausea and vomiting.  Genitourinary: Denied genitourinary symptoms including symptomatic vaginal discharge, pelvic relaxation issues, and urinary complaints.  Musculoskeletal: Denied musculoskeletal symptoms, stiffness, swelling, muscle weakness and myalgia.  Dermatologic: Denied dermatology symptoms, rash and scar.  Neurologic: Denied neurology symptoms, dizziness, headache, neck pain and syncope.  Psychiatric: Denied psychiatric symptoms, anxiety and depression.  Endocrine: Denied endocrine symptoms including hot flashes and night sweats.   Meds:   Current Outpatient Medications on File Prior to Visit  Medication Sig Dispense Refill  . estrogens, conjugated, (PREMARIN) 0.625 MG tablet TAKE 1 TABLET BY MOUTH EVERY DAY 90 tablet 1  . Multiple Vitamin (MULTIVITAMIN WITH MINERALS) TABS tablet Take 1 tablet by mouth daily.     No current facility-administered medications on file prior to visit.     Objective:     Vitals:   06/18/19 0940  BP: 136/72  Pulse: 66               Abdomen: Soft.  Non-tender.  No masses.  No HSM.  Incision/s: Intact.  Healing well.  No erythema.  No drainage.   Abdomen: Soft.  Non-tender.  No masses.  No HSM.  Incision/s: Intact.  Healing well.  No erythema.  No drainage.    Pelvic:   Vulva: Normal appearance.  No lesions.  Vagina: No lesions or abnormalities noted.  Support: Normal pelvic support.  Urethra No masses tenderness or scarring.  Meatus Normal size without lesions or prolapse  Vag Cuff: Intact.  No lesions.  Anus:  Normal exam.  No lesions.  Perineum: Normal exam.  No lesions.        Bimanual   Adnexae: No masses.  Non-tender to palpation.  Cuff: Negative for abnormality.         Assessment:    [id number] Patient Active Problem List    Diagnosis Date Noted  . Fibroids 04/21/2019  . Anemia 02/11/2019  . Symptomatic anemia 02/10/2019  . Family history of kidney cancer 10/07/2013  . Healthcare maintenance 01/19/2012  . Family history of breast cancer in mother 01/19/2012  . VARICOSE VEINS, LOWER EXTREMITIES 12/23/2008  . ALLERGIC RHINITIS 07/29/2008  . HEADACHE, TENSION 02/12/2008  . GAD (generalized anxiety disorder) 09/12/2007  . SCOLIOSIS, MILD 09/03/2007  . HYPERLIPIDEMIA 08/08/2007     1. Post-operative state     Patient with excellent recovery postop   Plan:            1.  Patient may resume normal activities in 1 week including intercourse.  She is to refrain from heavy lifting for the next 3 months. Orders No orders of the defined types were placed in this encounter.   No orders of the defined types were placed in this encounter.     F/U  Return in about 3 months (around 09/16/2019).  Finis Bud, M.D. 06/18/2019 10:05 AM

## 2019-09-19 ENCOUNTER — Encounter: Payer: 59 | Admitting: Obstetrics and Gynecology

## 2019-09-23 ENCOUNTER — Encounter: Payer: 59 | Admitting: Obstetrics and Gynecology

## 2019-10-07 ENCOUNTER — Encounter: Payer: 59 | Admitting: Obstetrics and Gynecology

## 2020-06-15 ENCOUNTER — Telehealth: Payer: Self-pay

## 2020-06-15 NOTE — Telephone Encounter (Signed)
mychart message sent to patient

## 2020-08-19 DIAGNOSIS — L570 Actinic keratosis: Secondary | ICD-10-CM | POA: Diagnosis not present

## 2020-08-19 DIAGNOSIS — L249 Irritant contact dermatitis, unspecified cause: Secondary | ICD-10-CM | POA: Diagnosis not present

## 2020-08-19 DIAGNOSIS — Z85828 Personal history of other malignant neoplasm of skin: Secondary | ICD-10-CM | POA: Diagnosis not present

## 2020-10-26 ENCOUNTER — Other Ambulatory Visit: Payer: Self-pay

## 2020-10-26 ENCOUNTER — Ambulatory Visit (INDEPENDENT_AMBULATORY_CARE_PROVIDER_SITE_OTHER): Payer: BC Managed Care – PPO | Admitting: Obstetrics and Gynecology

## 2020-10-26 ENCOUNTER — Encounter: Payer: Self-pay | Admitting: Obstetrics and Gynecology

## 2020-10-26 VITALS — BP 119/64 | HR 86 | Ht 62.0 in | Wt 135.5 lb

## 2020-10-26 DIAGNOSIS — Z13220 Encounter for screening for lipoid disorders: Secondary | ICD-10-CM

## 2020-10-26 DIAGNOSIS — Z1231 Encounter for screening mammogram for malignant neoplasm of breast: Secondary | ICD-10-CM | POA: Diagnosis not present

## 2020-10-26 DIAGNOSIS — Z01419 Encounter for gynecological examination (general) (routine) without abnormal findings: Secondary | ICD-10-CM

## 2020-10-26 DIAGNOSIS — B3731 Acute candidiasis of vulva and vagina: Secondary | ICD-10-CM

## 2020-10-26 DIAGNOSIS — B373 Candidiasis of vulva and vagina: Secondary | ICD-10-CM | POA: Diagnosis not present

## 2020-10-26 MED ORDER — FLUCONAZOLE 150 MG PO TABS: 150.0000 mg | ORAL_TABLET | ORAL | 3 refills | Status: DC

## 2020-10-26 NOTE — Progress Notes (Signed)
HPI:      [name] is a 45 y.o. [id number] who LMP was Patient's last menstrual period was 02/10/2019.  Subjective:   She presents today for her annual examination.  She was last seen in 2020 for her postop check.  She had an LAVH at that time.  She reports that she has recovered well and has no issues.  She has no complaints. Denies hot flashes or signs and symptoms of menopause    Hx: The following portions of the patient's history were reviewed and updated as appropriate:             She  has a past medical history of Anemia, Blood transfusion without reported diagnosis, Depression, Fibroid, HLD (hyperlipidemia), and Tension headache. She does not have any pertinent problems on file. She  has a past surgical history that includes Wisdom tooth extraction; Other surgical history; and Laparoscopic assisted vaginal hysterectomy (N/A, 04/21/2019). Her family history includes Cancer (age of onset: 63) in her mother; Cancer (age of onset: 17) in her cousin and paternal aunt; Coronary artery disease in her paternal grandfather; Hyperlipidemia in her father; Kidney disease (age of onset: 34) in her father. She  reports that she has never smoked. She has never used smokeless tobacco. She reports current alcohol use. She reports that she does not use drugs. She has a current medication list which includes the following prescription(s): fluconazole, multivitamin with minerals, and estrogens (conjugated). She has No Known Allergies.       Review of Systems:  Review of Systems  Constitutional: Denied constitutional symptoms, night sweats, recent illness, fatigue, fever, insomnia and weight loss.  Eyes: Denied eye symptoms, eye pain, photophobia, vision change and visual disturbance.  Ears/Nose/Throat/Neck: Denied ear, nose, throat or neck symptoms, hearing loss, nasal discharge, sinus congestion and sore throat.  Cardiovascular: Denied cardiovascular symptoms, arrhythmia, chest pain/pressure,  edema, exercise intolerance, orthopnea and palpitations.  Respiratory: Denied pulmonary symptoms, asthma, pleuritic pain, productive sputum, cough, dyspnea and wheezing.  Gastrointestinal: Denied, gastro-esophageal reflux, melena, nausea and vomiting.  Genitourinary: Denied genitourinary symptoms including symptomatic vaginal discharge, pelvic relaxation issues, and urinary complaints.  Musculoskeletal: Denied musculoskeletal symptoms, stiffness, swelling, muscle weakness and myalgia.  Dermatologic: Denied dermatology symptoms, rash and scar.  Neurologic: Denied neurology symptoms, dizziness, headache, neck pain and syncope.  Psychiatric: Denied psychiatric symptoms, anxiety and depression.  Endocrine: Denied endocrine symptoms including hot flashes and night sweats.   Meds:   Current Outpatient Medications on File Prior to Visit  Medication Sig Dispense Refill  . Multiple Vitamin (MULTIVITAMIN WITH MINERALS) TABS tablet Take 1 tablet by mouth daily.    Marland Kitchen estrogens, conjugated, (PREMARIN) 0.625 MG tablet TAKE 1 TABLET BY MOUTH EVERY DAY (Patient not taking: Reported on 10/26/2020) 90 tablet 1   No current facility-administered medications on file prior to visit.          Objective:     Vitals:   10/26/20 0756  BP: 119/64  Pulse: 86    Filed Weights   10/26/20 0756  Weight: 135 lb 8 oz (61.5 kg)              Physical examination General NAD, Conversant  HEENT Atraumatic; Op clear with mmm.  Normo-cephalic. Pupils reactive. Anicteric sclerae  Thyroid/Neck Smooth without nodularity or enlargement. Normal ROM.  Neck Supple.  Skin No rashes, lesions or ulceration. Normal palpated skin turgor. No nodularity.  Breasts: No masses or discharge.  Symmetric.  No axillary adenopathy.  Lungs: Clear to auscultation.No rales  or wheezes. Normal Respiratory effort, no retractions.  Heart: NSR.  No murmurs or rubs appreciated. No periferal edema  Abdomen: Soft.  Non-tender.  No masses.  No  HSM. No hernia  Extremities: Moves all appropriately.  Normal ROM for age. No lymphadenopathy.  Neuro: Oriented to PPT.  Normal mood. Normal affect.     Pelvic:   Vulva: Normal appearance.  No lesions.   Vagina: No lesions or abnormalities noted.  Thick white vaginal discharge consistent with monilia  Support: Normal pelvic support.  Urethra No masses tenderness or scarring.  Meatus Normal size without lesions or prolapse.  Cervix: Surgically absent   Anus: Normal exam.  No lesions.  Perineum: Normal exam.  No lesions.        Bimanual   Uterus: Surgically absent   Adnexae: No masses.  Non-tender to palpation.  Cul-de-sac: Negative for abnormality.     Assessment:    [id number] Patient Active Problem List   Diagnosis Date Noted  . Fibroids 04/21/2019  . Anemia 02/11/2019  . Symptomatic anemia 02/10/2019  . Family history of kidney cancer 10/07/2013  . Healthcare maintenance 01/19/2012  . Family history of breast cancer in mother 01/19/2012  . VARICOSE VEINS, LOWER EXTREMITIES 12/23/2008  . ALLERGIC RHINITIS 07/29/2008  . HEADACHE, TENSION 02/12/2008  . GAD (generalized anxiety disorder) 09/12/2007  . SCOLIOSIS, MILD 09/03/2007  . HYPERLIPIDEMIA 08/08/2007     1. Well woman exam with routine gynecological exam   2. Encounter for screening mammogram for malignant neoplasm of breast   3. Lipid screening   4. Monilial vulvovaginitis     Patient has a history of recurrent monilia.  Although her symptoms a few her examination shows monilia vaginitis.  Patient otherwise doing very well.   Plan:            1.  Basic Screening Recommendations The basic screening recommendations for asymptomatic women were discussed with the patient during her visit.  The age-appropriate recommendations were discussed with her and the rational for the tests reviewed.  When I am informed by the patient that another primary care physician has previously obtained the age-appropriate tests and they  are up-to-date, only outstanding tests are ordered and referrals given as necessary.  Abnormal results of tests will be discussed with her when all of her results are completed.  Routine preventative health maintenance measures emphasized: Exercise/Diet/Weight control, Tobacco Warnings, Alcohol/Substance use risks and Stress Management Mammogram ordered-referral for colonoscopy- blood work today. 2.  Diflucan for monilia vaginitis Orders Orders Placed This Encounter  Procedures  . MM 3D SCREEN BREAST BILATERAL  . Hemoglobin A1c  . Lipid panel  . TSH  . Ambulatory referral to Gastroenterology     Meds ordered this encounter  Medications  . fluconazole (DIFLUCAN) 150 MG tablet    Sig: Take 1 tablet (150 mg total) by mouth every 3 (three) days. For three doses    Dispense:  3 tablet    Refill:  3          F/U  Return in about 1 year (around 10/26/2021) for Annual Physical.  Finis Bud, M.D. 10/26/2020 8:48 AM

## 2020-11-08 ENCOUNTER — Other Ambulatory Visit: Payer: BC Managed Care – PPO

## 2020-11-08 ENCOUNTER — Other Ambulatory Visit: Payer: Self-pay

## 2020-11-08 DIAGNOSIS — Z01419 Encounter for gynecological examination (general) (routine) without abnormal findings: Secondary | ICD-10-CM | POA: Diagnosis not present

## 2020-11-08 DIAGNOSIS — Z13220 Encounter for screening for lipoid disorders: Secondary | ICD-10-CM | POA: Diagnosis not present

## 2020-11-09 ENCOUNTER — Encounter: Payer: Self-pay | Admitting: *Deleted

## 2020-11-09 LAB — LIPID PANEL
Chol/HDL Ratio: 4.7 ratio — ABNORMAL HIGH (ref 0.0–4.4)
Cholesterol, Total: 219 mg/dL — ABNORMAL HIGH (ref 100–199)
HDL: 47 mg/dL (ref >39)
LDL Chol Calc (NIH): 139 mg/dL — ABNORMAL HIGH (ref 0–99)
Triglycerides: 182 mg/dL — ABNORMAL HIGH (ref 0–149)
VLDL Cholesterol Cal: 33 mg/dL (ref 5–40)

## 2020-11-09 LAB — TSH: TSH: 0.995 u[IU]/mL (ref 0.450–4.500)

## 2020-11-09 LAB — HEMOGLOBIN A1C
Est. average glucose Bld gHb Est-mCnc: 103 mg/dL
Hgb A1c MFr Bld: 5.2 % (ref 4.8–5.6)

## 2020-12-07 ENCOUNTER — Other Ambulatory Visit: Payer: Self-pay

## 2020-12-07 ENCOUNTER — Encounter: Payer: Self-pay | Admitting: Family Medicine

## 2020-12-07 ENCOUNTER — Ambulatory Visit: Payer: BC Managed Care – PPO | Admitting: Family Medicine

## 2020-12-07 VITALS — BP 104/72 | HR 75 | Temp 98.3°F | Ht 62.0 in | Wt 134.0 lb

## 2020-12-07 DIAGNOSIS — Z1159 Encounter for screening for other viral diseases: Secondary | ICD-10-CM | POA: Diagnosis not present

## 2020-12-07 DIAGNOSIS — E782 Mixed hyperlipidemia: Secondary | ICD-10-CM

## 2020-12-07 NOTE — Assessment & Plan Note (Signed)
Patient with hyperlipidemia noted on routine testing by her cholesterol OB/GYN.  She is currently asymptomatic and her cardiac, pulmonary, GI exams are benign.  We did assess her risk using the ASCVD calculator which places her on modified risk score at 0.9, with optimal interventions this does drop to 0.6.  At this stage she would not benefit from statin pharmacotherapy though we did spend extensive time in regards to further dietary, activity, and supplement interventions.  Patient has a background in nutrition so she is well versed in this subject.  In addition to general diet and exercise recommendations I made the following specific recommendations: - Specifically try to incorporate activity after meals - Consider higher intensity activity in addition to your current routine - Make cutbacks in diet where able to do so - Consider supplements such as omega-3 fatty acids, red yeast rice, berberine, and green tea extracts - Reduction in saturated fat and total caloric intake specifically for LDL reduction - Obtain blood work with orders provided, our office will contact you with results - Return in 6 months   Serum studies were obtained for further restratification and a plan to recheck patient's lipid panel in 6 months, she will return for a visit at that time.  She was advised to obtain a new lipid panel prior to that visit, she can contact us for the order.

## 2020-12-07 NOTE — Progress Notes (Signed)
Primary Care / Sports Medicine Office Visit  Patient Information:  Patient ID: Ashley Murray, female DOB: December 18, 1975 Age: 45 y.o. MRN: [id number]   Ashley Murray is a pleasant 45 y.o. female presenting with the following:  Chief Complaint  Patient presents with  . Establish Care    Speak to pt about labs work from [place] from 11/2020 pt had high lipid levels     Review of Systems pertinent details above   Patient Active Problem List   Diagnosis Date Noted  . Fibroids 04/21/2019  . Anemia 02/11/2019  . Symptomatic anemia 02/10/2019  . Family history of kidney cancer 10/07/2013  . Healthcare maintenance 01/19/2012  . Family history of breast cancer in mother 01/19/2012  . VARICOSE VEINS, LOWER EXTREMITIES 12/23/2008  . ALLERGIC RHINITIS 07/29/2008  . HEADACHE, TENSION 02/12/2008  . GAD (generalized anxiety disorder) 09/12/2007  . SCOLIOSIS, MILD 09/03/2007  . HYPERLIPIDEMIA 08/08/2007   Past Medical History:  Diagnosis Date  . Anemia   . Blood transfusion without reported diagnosis   . Depression   . Fibroid   . HLD (hyperlipidemia)   . Tension headache    Outpatient Medications Prior to Visit  Medication Sig Dispense Refill  . hydrocortisone 2.5 % cream Apply to eyelids twice daily as needed until improved then stop. Restart as needed    . Multiple Vitamin (MULTIVITAMIN WITH MINERALS) TABS tablet Take 1 tablet by mouth daily.    Marland Kitchen estrogens, conjugated, (PREMARIN) 0.625 MG tablet TAKE 1 TABLET BY MOUTH EVERY DAY (Patient not taking: Reported on 10/26/2020) 90 tablet 1  . fluconazole (DIFLUCAN) 150 MG tablet Take 1 tablet (150 mg total) by mouth every 3 (three) days. For three doses 3 tablet 3  . multivitamin (VIT W/EXTRA C) CHEW chewable tablet Chew by mouth.     No facility-administered medications prior to visit.    Past Surgical History:  Procedure Laterality Date  . LAPAROSCOPIC ASSISTED VAGINAL HYSTERECTOMY N/A 04/21/2019   Procedure: LAPAROSCOPIC ASSISTED  VAGINAL HYSTERECTOMY;  Surgeon: Harlin Heys, MD;  Location: [facility] ORS;  Service: Gynecology;  Laterality: N/A;  . OTHER SURGICAL HISTORY     Hospitalized [place] 2009 for muscle problems, EMGs of both upper arms  neg 09/12/07-Dr Carlis Abbott, neuro abd, US WNL 04/2009  . WISDOM TOOTH EXTRACTION     Social History   Socioeconomic History  . Marital status: Married    Spouse name: Not on file  . Number of children: 2  . Years of education: Not on file  . Highest education level: Not on file  Occupational History  . Not on file  Tobacco Use  . Smoking status: Never Smoker  . Smokeless tobacco: Never Used  Vaping Use  . Vaping Use: Never used  Substance and Sexual Activity  . Alcohol use: Yes    Comment: 1-2 glasses of wine a week  . Drug use: No  . Sexual activity: Yes    Birth control/protection: None  Other Topics Concern  . Not on file  Social History Narrative   caffeine: 1 cup coffee/day, some tea in summer   Marital Status: Married   Children: 2-- (2008 and 2005)   Lives with 2 sons and husband, 1 dog, 1 cat   Occupation: IT sales professional at [employer].   Husband is Mudlogger of dining services at [employer]--6/09 no longer at [employer]   Activity: walks every other day, plays softball weekly   Diet: good water, daily fruits/vegetables, red meat 1-2x/wk, fish  rarely   Social Determinants of Radio broadcast assistant Strain: Not on file  Food Insecurity: Not on file  Transportation Needs: Not on file  Physical Activity: Not on file  Stress: Not on file  Social Connections: Not on file  Intimate Partner Violence: Not on file   Family History  Problem Relation Age of Onset  . Cancer Mother 25       BRCA gene neg  . Coronary artery disease Paternal Grandfather        Bypass at older age  . Hyperlipidemia Father   . Kidney disease Father 42       kidney polyp s/p benign biopsy  . Cancer Cousin 60       kidney  . Cancer Paternal Aunt 76       kidney  .  Diabetes Neg Hx   . Stroke Neg Hx   . Hypertension Neg Hx    No Known Allergies  Vitals:   12/07/20 1425  BP: 104/72  Pulse: 75  Temp: 98.3 F (36.8 C)  SpO2: 96%   Vitals:   12/07/20 1425  Weight: 134 lb (60.8 kg)  Height: _0  (1.575 m)   Body mass index is 24.51 kg/m.  No results found.   Independent interpretation of notes and tests performed by another provider:   None  Procedures performed:   None  Pertinent History, Exam, Impression, and Recommendations:   HYPERLIPIDEMIA Patient with hyperlipidemia noted on routine testing by her cholesterol OB/GYN.  She is currently asymptomatic and her cardiac, pulmonary, GI exams are benign.  We did assess her risk using the ASCVD calculator which places her on modified risk score at 0.9, with optimal interventions this does drop to 0.6.  At this stage she would not benefit from statin pharmacotherapy though we did spend extensive time in regards to further dietary, activity, and supplement interventions.  Patient has a background in nutrition so she is well versed in this subject.  In addition to general diet and exercise recommendations I made the following specific recommendations: - Specifically try to incorporate activity after meals - Consider higher intensity activity in addition to your current routine - Make cutbacks in diet where able to do so - Consider supplements such as omega-3 fatty acids, red yeast rice, berberine, and green tea extracts - Reduction in saturated fat and total caloric intake specifically for LDL reduction - Obtain blood work with orders provided, our office will contact you with results - Return in 6 months   Serum studies were obtained for further restratification and a plan to recheck patient's lipid panel in 6 months, she will return for a visit at that time.  She was advised to obtain a new lipid panel prior to that visit, she can contact us for the order.  I provided a total time of 48  minutes including both face-to-face and non-face-to-face time on 12/07/20 inclusive of time utilized for medical chart review, information gathering, care coordination with staff, and documentation completion.  Orders & Medications No orders of the defined types were placed in this encounter.  Orders Placed This Encounter  Procedures  . Hepatitis C Antibody  . CBC  . Comprehensive metabolic panel  . VITAMIN D 25 Hydroxy (Vit-D Deficiency, Fractures)     Return in about 6 months (around 06/08/2021) for physical.     Montel Culver, MD   [place]

## 2020-12-07 NOTE — Patient Instructions (Addendum)
Diet & Exercise Recommendations Dietary changes to include reducing saturated fats, sodium, avoiding red meat, fried, processed foods, full-fat dairy, baked goods, and sweets. Incorporate more fruits, vegetables, fiber-rich foods such as whole grains, and transition to more eggs and lean meats. Make realistic changes where you can and stick to it!  Physical activity should be focused on getting 150 to 300 minutes per week of moderate to vigorous activity (30 minutes of activity at least 5 days of the week at a level of intensity where you can carry on a conversation without being out of breath). Any increase in activity is beneficial to your health! . Physical activity reduces symptoms of depression and anxiety and improves sleep quality. . Evidence for the benefits of physical activity for weight gain, adiposity, and bone health exists for children as young as three years and on up. . Increasing physical activity in older adults can help them maintain independence by reducing cognitive decline and falls.  - As above, specifically try to incorporate activity after meals - Consider higher intensity activity in addition to your current routine - Make cutbacks in diet where able to do so - Consider supplements such as omega-3 fatty acids, red yeast rice, berberine, and green tea extracts - Reduction in saturated fat and total caloric intake specifically for LDL reduction - Obtain blood work with orders provided, our office will contact you with results - Return in 6 months

## 2020-12-08 LAB — CBC
Hematocrit: 41.1 % (ref 34.0–46.6)
Hemoglobin: 14.1 g/dL (ref 11.1–15.9)
MCH: 31.4 pg (ref 26.6–33.0)
MCHC: 34.3 g/dL (ref 31.5–35.7)
MCV: 92 fL (ref 79–97)
Platelets: 291 10*3/uL (ref 150–450)
RBC: 4.49 x10E6/uL (ref 3.77–5.28)
RDW: 12 % (ref 11.7–15.4)
WBC: 9.7 10*3/uL (ref 3.4–10.8)

## 2020-12-08 LAB — COMPREHENSIVE METABOLIC PANEL
ALT: 15 IU/L (ref 0–32)
AST: 15 IU/L (ref 0–40)
Albumin/Globulin Ratio: 2.2 (ref 1.2–2.2)
Albumin: 4.9 g/dL — ABNORMAL HIGH (ref 3.8–4.8)
Alkaline Phosphatase: 70 IU/L (ref 44–121)
BUN/Creatinine Ratio: 17 (ref 9–23)
BUN: 14 mg/dL (ref 6–24)
Bilirubin Total: 0.4 mg/dL (ref 0.0–1.2)
CO2: 21 mmol/L (ref 20–29)
Calcium: 9.4 mg/dL (ref 8.7–10.2)
Chloride: 102 mmol/L (ref 96–106)
Creatinine, Ser: 0.83 mg/dL (ref 0.57–1.00)
Globulin, Total: 2.2 g/dL (ref 1.5–4.5)
Glucose: 83 mg/dL (ref 65–99)
Potassium: 4.5 mmol/L (ref 3.5–5.2)
Sodium: 138 mmol/L (ref 134–144)
Total Protein: 7.1 g/dL (ref 6.0–8.5)
eGFR: 87 mL/min/{1.73_m2} (ref >59)

## 2020-12-08 LAB — HEPATITIS C ANTIBODY: Hep C Virus Ab: <0.1 s/co ratio (ref 0.0–0.9)

## 2020-12-08 LAB — VITAMIN D 25 HYDROXY (VIT D DEFICIENCY, FRACTURES): Vit D, 25-Hydroxy: 35.1 ng/mL (ref 30.0–100.0)

## 2021-05-02 ENCOUNTER — Ambulatory Visit
Admission: RE | Admit: 2021-05-02 | Discharge: 2021-05-02 | Disposition: A | Discharge status: Home / Self Care | Payer: BC Managed Care – PPO | Source: Ambulatory Visit | Attending: Obstetrics and Gynecology | Admitting: Obstetrics and Gynecology

## 2021-05-02 ENCOUNTER — Other Ambulatory Visit: Payer: Self-pay

## 2021-05-02 DIAGNOSIS — Z1231 Encounter for screening mammogram for malignant neoplasm of breast: Secondary | ICD-10-CM | POA: Diagnosis not present

## 2021-05-04 ENCOUNTER — Inpatient Hospital Stay
Admission: RE | Admit: 2021-05-04 | Discharge: 2021-05-04 | Disposition: A | Discharge status: Home / Self Care | Payer: Self-pay | Source: Ambulatory Visit | Attending: *Deleted | Admitting: *Deleted

## 2021-05-04 ENCOUNTER — Other Ambulatory Visit: Payer: Self-pay | Admitting: *Deleted

## 2021-05-04 DIAGNOSIS — Z1231 Encounter for screening mammogram for malignant neoplasm of breast: Secondary | ICD-10-CM

## 2021-06-08 ENCOUNTER — Encounter: Payer: BC Managed Care – PPO | Admitting: Family Medicine

## 2021-06-15 ENCOUNTER — Encounter: Payer: BC Managed Care – PPO | Admitting: Family Medicine

## 2021-06-28 ENCOUNTER — Encounter: Payer: BC Managed Care – PPO | Admitting: Family Medicine

## 2021-08-01 ENCOUNTER — Ambulatory Visit: Payer: BC Managed Care – PPO | Admitting: Family Medicine

## 2021-08-09 ENCOUNTER — Telehealth: Payer: Self-pay

## 2021-08-09 ENCOUNTER — Encounter: Payer: Self-pay | Admitting: Family Medicine

## 2021-08-09 ENCOUNTER — Other Ambulatory Visit: Payer: Self-pay

## 2021-08-09 ENCOUNTER — Ambulatory Visit (INDEPENDENT_AMBULATORY_CARE_PROVIDER_SITE_OTHER): Payer: BC Managed Care – PPO | Admitting: Family Medicine

## 2021-08-09 VITALS — BP 98/72 | HR 74 | Ht 62.0 in | Wt 139.0 lb

## 2021-08-09 DIAGNOSIS — R220 Localized swelling, mass and lump, head: Secondary | ICD-10-CM

## 2021-08-09 DIAGNOSIS — E782 Mixed hyperlipidemia: Secondary | ICD-10-CM | POA: Diagnosis not present

## 2021-08-09 DIAGNOSIS — Z23 Encounter for immunization: Secondary | ICD-10-CM | POA: Diagnosis not present

## 2021-08-09 DIAGNOSIS — Z0000 Encounter for general adult medical examination without abnormal findings: Secondary | ICD-10-CM

## 2021-08-09 DIAGNOSIS — Z1211 Encounter for screening for malignant neoplasm of colon: Secondary | ICD-10-CM | POA: Diagnosis not present

## 2021-08-09 NOTE — Assessment & Plan Note (Signed)
Previously noted during routine work-up by her gynecologist, this is despite healthy eating habits and BMI control.  At last visit on 12/07/2020 plan was for specific activity and supplement interventions.  Patient has since incorporated red yeast rice, tolerating well.  Plan today for recheck of labs, anticipatory guidance provided, we will follow-up once resulted.

## 2021-08-09 NOTE — Patient Instructions (Addendum)
-   Obtain fasting labs with orders provided (can have water or black coffee but otherwise no food or drink x 8 hours before labs) - Can trial Benadryl for reported tongue swelling, if beneficial, consider daily over-the-counter antihistamine (Claritin, Zyrtec) on a seasonal basis - If symptoms worsen, contact her office for next steps - Review information provided - Maintain follow-up with dermatology annually - Attend eye doctor annually, dentist every 6 months, work towards or maintain 30 minutes of moderate intensity physical activity at least 5 days per week, and consume a balanced diet - Return in 1 year for physical - Contact us for any questions between now and then

## 2021-08-09 NOTE — Progress Notes (Signed)
Annual Physical Exam Visit  Patient Information:  Patient ID: Ashley Murray, female DOB: 1976/03/21 Age: 46 y.o. MRN: [id number]   Subjective:   CC: Annual Physical Exam  HPI:  Ashley Murray is here for their annual physical.  I reviewed the past medical history, family history, social history, surgical history, and allergies today and changes were made as necessary.  Please see the problem list section below for additional details.  Past Medical History: Past Medical History:  Diagnosis Date   Anemia    Blood transfusion without reported diagnosis    Depression    Fibroid    HLD (hyperlipidemia)    Tension headache    Past Surgical History: Past Surgical History:  Procedure Laterality Date   LAPAROSCOPIC ASSISTED VAGINAL HYSTERECTOMY N/A 04/21/2019   Procedure: LAPAROSCOPIC ASSISTED VAGINAL HYSTERECTOMY;  Surgeon: Harlin Heys, MD;  Location: [facility] ORS;  Service: Gynecology;  Laterality: N/A;   OTHER SURGICAL HISTORY     Hospitalized [facility] 2009 for muscle problems, EMGs of both upper arms  neg 09/12/07-Dr Carlis Abbott, neuro abd, US WNL 04/2009   WISDOM TOOTH EXTRACTION     Family History: Family History  Problem Relation Age of Onset   Breast cancer Mother 69       age 79 then age 66   Cancer Mother 71       BRCA gene neg   Hyperlipidemia Father    Kidney disease Father 26       kidney polyp s/p benign biopsy   Cancer Paternal Aunt 60       kidney   Coronary artery disease Paternal Grandfather        Bypass at older age   56 Cousin 3       kidney   Diabetes Neg Hx    Stroke Neg Hx    Hypertension Neg Hx    Allergies: No Known Allergies Health Maintenance: Health Maintenance  Topic Date Due   COLONOSCOPY (Pts 45-67yr Insurance coverage will need to be confirmed)  Never done   COVID-19 Vaccine (4 - Booster for Pfizer series) 07/21/2020   HIV Screening  12/07/2021 (Originally 11/06/1988)   TETANUS/TDAP  11/08/2022   INFLUENZA VACCINE  Completed    Hepatitis C Screening  Completed   HPV VACCINES  Aged Out   PAP SMEAR-Modifier  Discontinued    HM Colonoscopy           Overdue - COLONOSCOPY (Pts 45-468yrInsurance coverage will need to be confirmed) (Every 10 Years) Overdue - never done    No completion history exists for this topic.           Medications: Current Outpatient Medications on File Prior to Visit  Medication Sig Dispense Refill   Multiple Vitamin (MULTIVITAMIN WITH MINERALS) TABS tablet Take 1 tablet by mouth daily.     Red Yeast Rice Extract (RED YEAST RICE PO) Take 2 capsules by mouth daily.     No current facility-administered medications on file prior to visit.    Review of Systems: No headache, visual changes, nausea, vomiting, diarrhea, constipation, dizziness, abdominal pain, skin rash, fevers, chills, night sweats, swollen lymph nodes, weight loss, chest pain, body aches, joint swelling, muscle aches, shortness of breath, mood changes, visual or auditory hallucinations reported. +tongue swelling  Objective:   Vitals:   08/09/21 0846  BP: 98/72  Pulse: 74   Vitals:   08/09/21 0846  Weight: 139 lb (63 kg)  Height: _0  (1.575 m)   Body  mass index is 25.42 kg/m.  General: Well Developed, well nourished, and in no acute distress.  Neuro: Alert and oriented x3, extra-ocular muscles intact, sensation grossly intact. Cranial nerves II through XII are grossly intact, motor, sensory, and coordinative functions are intact. HEENT: Normocephalic, atraumatic, pupils equal round reactive to light, neck supple, no masses, no lymphadenopathy, thyroid nonpalpable. Oropharynx, external ear canals are unremarkable.  Nasopharynx with mildly erythematous turbinates bilaterally, tongue benign, no features of scalloping, no elements of geographic tongue, normal texture noted, swelling. Skin: Warm and dry, no rashes noted.  Cardiac: Regular rate and rhythm, no murmurs rubs or gallops. No peripheral edema. Pulses  symmetric. Respiratory: Clear to auscultation bilaterally. Not using accessory muscles, speaking in full sentences.  Abdominal: Soft, nontender, nondistended, positive bowel sounds, no masses, no organomegaly. Musculoskeletal: Shoulder, elbow, wrist, hip, knee, ankle stable, and with full range of motion.  Female chaperone initials: BN present throughout the physical examination.  Impression and Recommendations:   The patient was counselled, risk factors were discussed, and anticipatory guidance given.  HYPERLIPIDEMIA Previously noted during routine work-up by her gynecologist, this is despite healthy eating habits and BMI control.  At last visit on 12/07/2020 plan was for specific activity and supplement interventions.  Patient has since incorporated red yeast rice, tolerating well.  Plan today for recheck of labs, anticipatory guidance provided, we will follow-up once resulted.  Mild tongue swelling Ongoing for a few weeks, denies any specific change in diet, no new exposures, noted primarily at night, does not compromise breathing.  Given her reassuring physical examination findings, this can be considered an allergic response to an unspecified allergen.  I have advised her to trial Benadryl, if effective, she may benefit from seasonal dosing of OTC antihistamine such as Claritin, Zyrtec, etc.  If worsening, she is to contact our office for further evaluation.  Annual physical exam Annual examination completed, risk stratification labs ordered, anticipatory guidance provided.  Annual influenza vaccination administered today.  We will follow labs once resulted.  Orders & Medications Medications: No orders of the defined types were placed in this encounter.  Orders Placed This Encounter  Procedures   Flu Vaccine QUAD 54moIM (Fluarix, Fluzone & Alfiuria Quad PF)   Lipid panel   Apo A1 + B + Ratio   Comprehensive metabolic panel   CBC   Ambulatory referral to Gastroenterology      Return in about 1 year (around 08/09/2022) for Annual physical.    JMontel Culver MD   [facility]

## 2021-08-09 NOTE — Telephone Encounter (Signed)
CALLED PATIENT NO ANSWER LEFT VOICEMAIL FOR A CALL BACK ? ?

## 2021-08-09 NOTE — Assessment & Plan Note (Signed)
Annual examination completed, risk stratification labs ordered, anticipatory guidance provided.  Annual influenza vaccination administered today.  We will follow labs once resulted.

## 2021-08-09 NOTE — Assessment & Plan Note (Signed)
Ongoing for a few weeks, denies any specific change in diet, no new exposures, noted primarily at night, does not compromise breathing.  Given her reassuring physical examination findings, this can be considered an allergic response to an unspecified allergen.  I have advised her to trial Benadryl, if effective, she may benefit from seasonal dosing of OTC antihistamine such as Claritin, Zyrtec, etc.  If worsening, she is to contact our office for further evaluation.

## 2021-08-10 ENCOUNTER — Telehealth: Payer: Self-pay

## 2021-08-10 NOTE — Telephone Encounter (Signed)
CALLED PATIENT NO ANSWER LEFT VOICEMAIL FOR A CALL BACK  letter sent 

## 2022-01-17 ENCOUNTER — Encounter: Payer: Self-pay | Admitting: Family Medicine

## 2022-03-04 IMAGING — MG MM DIGITAL SCREENING BILAT W/ TOMO AND CAD
8 series · 8 of 24 positions shown · non-contrast
Comparison: Previous exam(s).

CLINICAL DATA: Screening.

EXAM:
DIGITAL SCREENING BILATERAL MAMMOGRAM WITH TOMOSYNTHESIS AND CAD
TECHNIQUE: Bilateral screening digital craniocaudal and mediolateral oblique
mammograms were obtained. Bilateral screening digital breast
tomosynthesis was performed. The images were evaluated with
computer-aided detection.

[L MLO synth-2D]
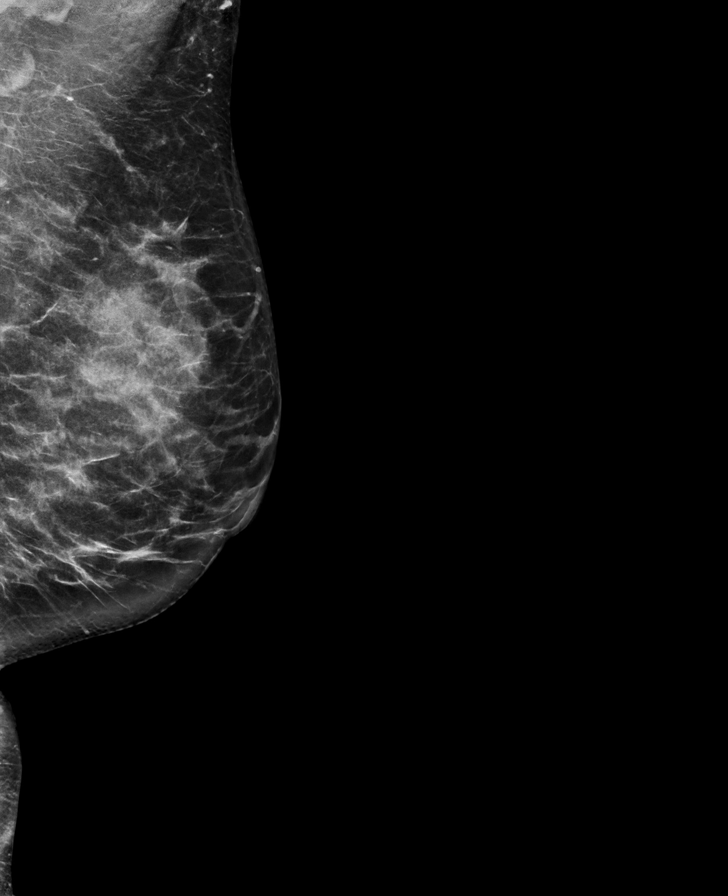

[R MLO synth-2D]
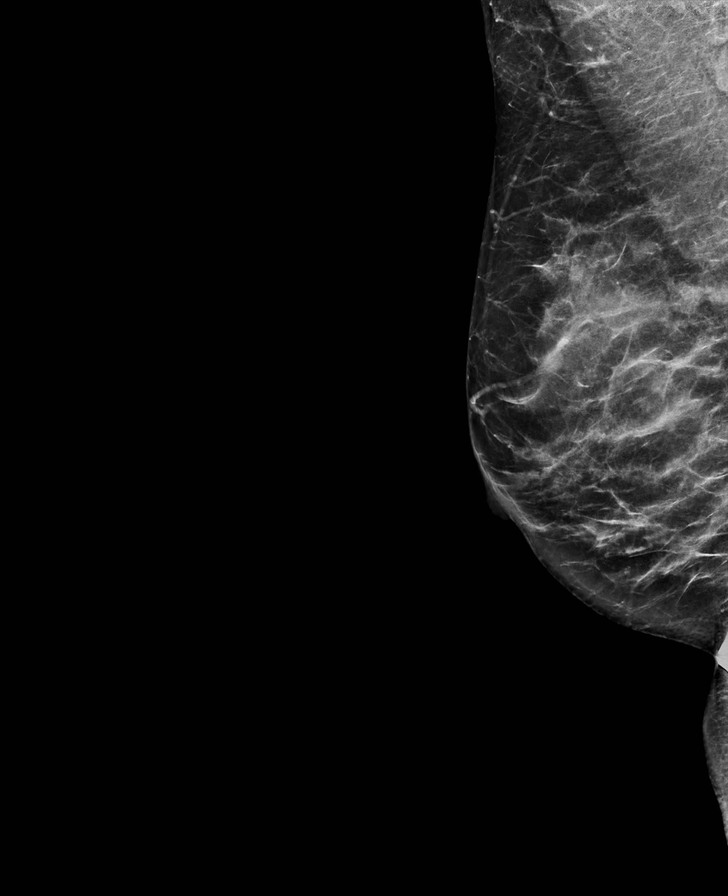

[L CC synth-2D]
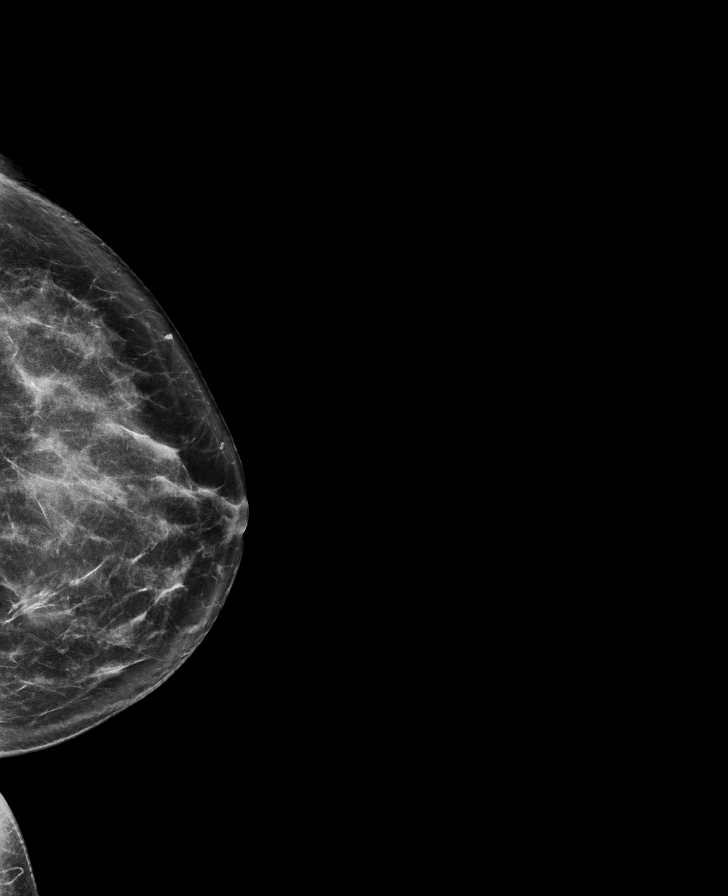

[R CC synth-2D]
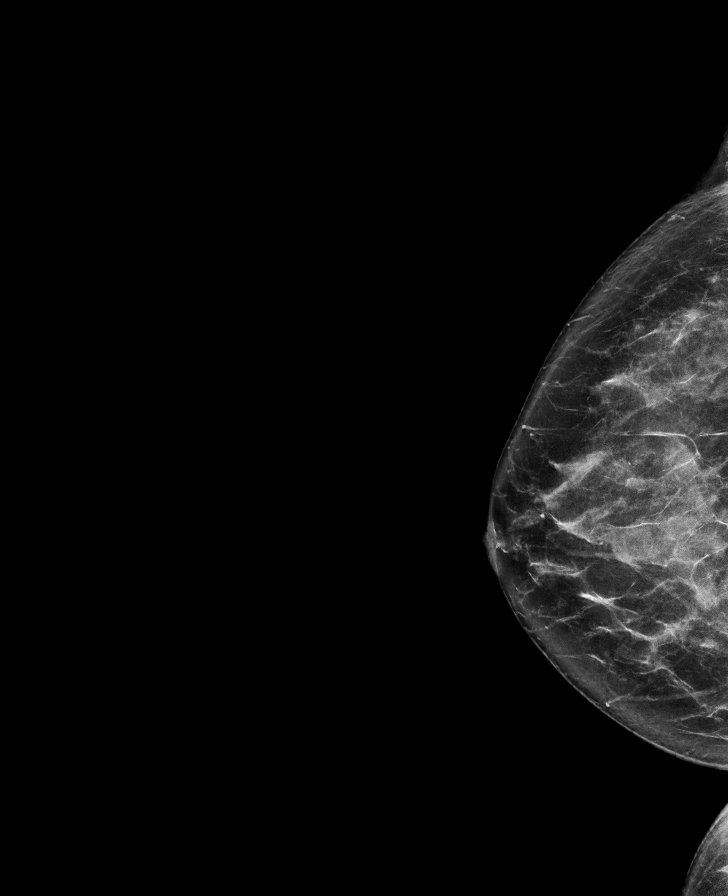

[L MLO tomo · tomo slice 39/77.0]
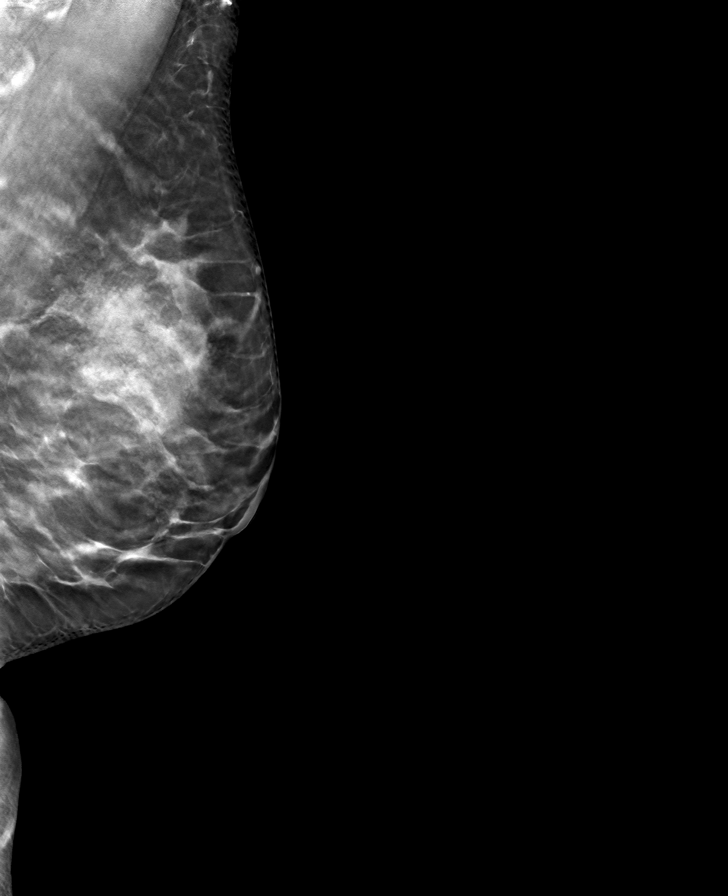

[R CC tomo · tomo slice 37/72.0]
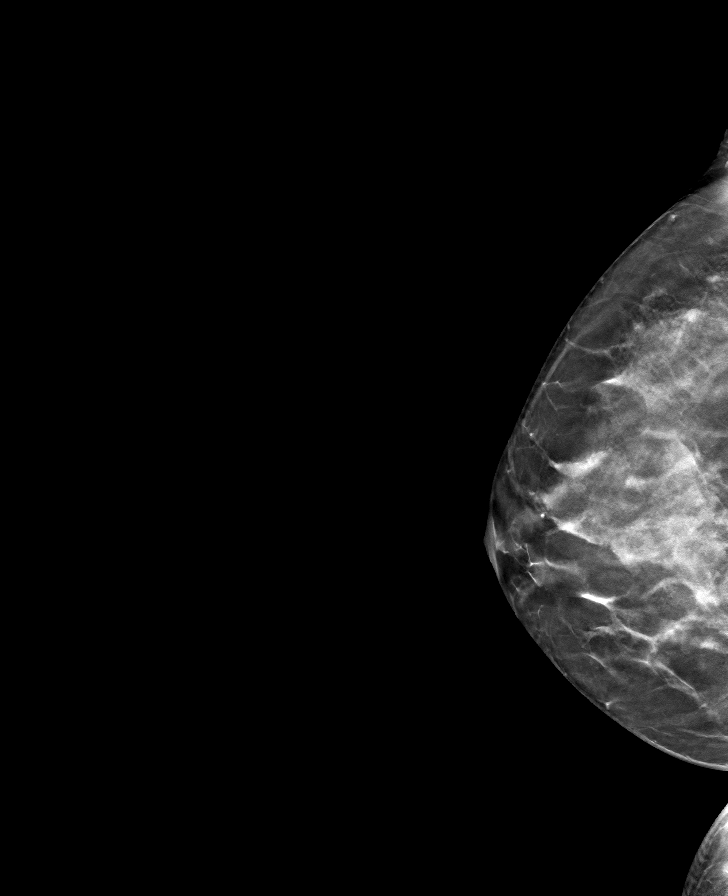

[L CC tomo · tomo slice 38/75.0]
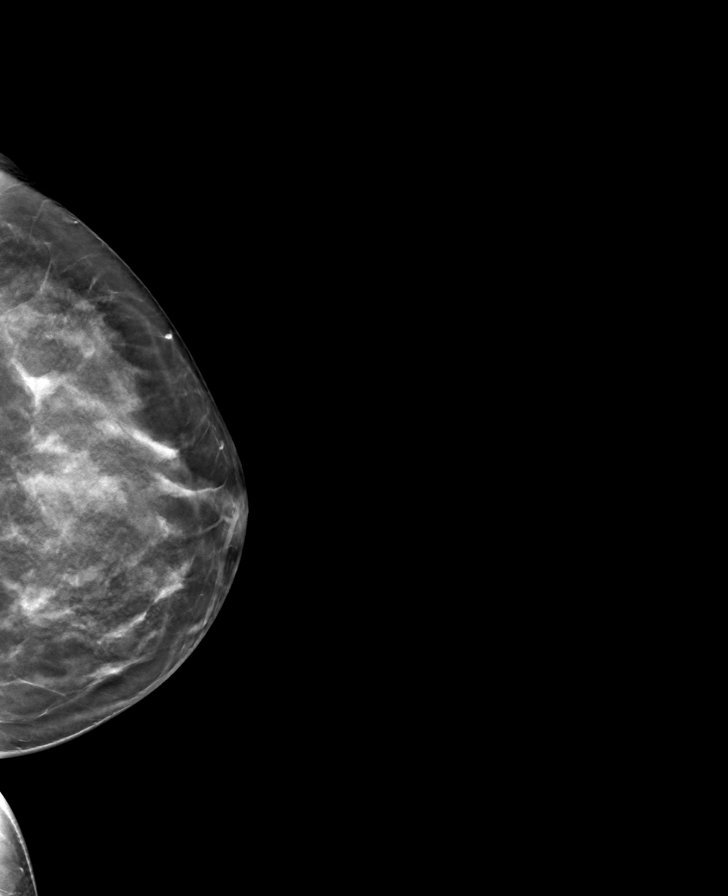

[R MLO tomo · tomo slice 35/70.0]
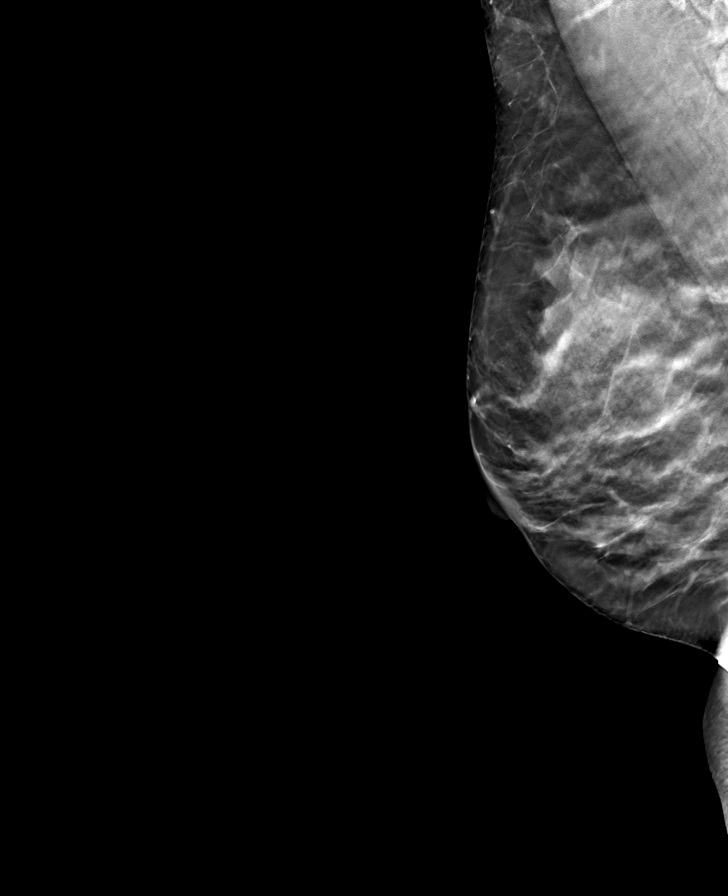

[8 of 24 positions shown; findings below may reference images not displayed]

ACR Breast Density Category c: The breast tissue is heterogeneously
dense, which may obscure small masses.
FINDINGS: There are no findings suspicious for malignancy.
IMPRESSION: No mammographic evidence of malignancy. A result letter of this
screening mammogram will be mailed directly to the patient.

RECOMMENDATION:
Screening mammogram in one year. (Code:Q3-W-BC3)

BI-RADS CATEGORY  1: Negative.

## 2022-04-25 DIAGNOSIS — Z90710 Acquired absence of both cervix and uterus: Secondary | ICD-10-CM | POA: Insufficient documentation

## 2022-05-05 ENCOUNTER — Other Ambulatory Visit: Payer: Self-pay | Admitting: Family Medicine

## 2022-05-05 DIAGNOSIS — Z1231 Encounter for screening mammogram for malignant neoplasm of breast: Secondary | ICD-10-CM

## 2022-06-14 ENCOUNTER — Ambulatory Visit
Admission: RE | Admit: 2022-06-14 | Discharge: 2022-06-14 | Disposition: A | Discharge status: Home / Self Care | Payer: BC Managed Care – PPO | Source: Ambulatory Visit | Attending: Family Medicine | Admitting: Family Medicine

## 2022-06-14 DIAGNOSIS — Z1231 Encounter for screening mammogram for malignant neoplasm of breast: Secondary | ICD-10-CM | POA: Diagnosis not present

## 2022-06-28 ENCOUNTER — Encounter: Payer: Self-pay | Admitting: Family Medicine

## 2022-08-09 ENCOUNTER — Encounter: Payer: BC Managed Care – PPO | Admitting: Family Medicine

## 2022-09-07 ENCOUNTER — Encounter: Payer: BC Managed Care – PPO | Admitting: Family Medicine

## 2022-10-16 ENCOUNTER — Ambulatory Visit: Payer: BC Managed Care – PPO | Admitting: Gastroenterology

## 2022-10-18 ENCOUNTER — Encounter: Payer: BC Managed Care – PPO | Admitting: Family Medicine

## 2022-11-27 ENCOUNTER — Encounter: Payer: BC Managed Care – PPO | Admitting: Family Medicine

## 2022-12-21 ENCOUNTER — Ambulatory Visit: Payer: BC Managed Care – PPO | Admitting: Gastroenterology

## 2023-01-05 ENCOUNTER — Ambulatory Visit (INDEPENDENT_AMBULATORY_CARE_PROVIDER_SITE_OTHER): Payer: BC Managed Care – PPO | Admitting: Family Medicine

## 2023-01-05 ENCOUNTER — Encounter: Payer: Self-pay | Admitting: Family Medicine

## 2023-01-05 VITALS — BP 128/78 | HR 67 | Ht 62.0 in | Wt 147.0 lb

## 2023-01-05 DIAGNOSIS — Z0000 Encounter for general adult medical examination without abnormal findings: Secondary | ICD-10-CM | POA: Diagnosis not present

## 2023-01-05 DIAGNOSIS — Z23 Encounter for immunization: Secondary | ICD-10-CM | POA: Diagnosis not present

## 2023-01-05 DIAGNOSIS — I8390 Asymptomatic varicose veins of unspecified lower extremity: Secondary | ICD-10-CM

## 2023-01-05 DIAGNOSIS — Z1211 Encounter for screening for malignant neoplasm of colon: Secondary | ICD-10-CM

## 2023-01-05 DIAGNOSIS — E559 Vitamin D deficiency, unspecified: Secondary | ICD-10-CM

## 2023-01-05 DIAGNOSIS — D649 Anemia, unspecified: Secondary | ICD-10-CM

## 2023-01-05 DIAGNOSIS — Z114 Encounter for screening for human immunodeficiency virus [HIV]: Secondary | ICD-10-CM

## 2023-01-05 DIAGNOSIS — E782 Mixed hyperlipidemia: Secondary | ICD-10-CM | POA: Diagnosis not present

## 2023-01-05 NOTE — Patient Instructions (Signed)
-   Obtain fasting labs with orders provided (can have water or black coffee but otherwise no food or drink x 8 hours before labs) °- Review information provided °- Attend eye doctor annually, dentist every 6 months, work towards or maintain 30 minutes of moderate intensity physical activity at least 5 days per week, and consume a balanced diet °- Return in 1 year for physical °- Contact us for any questions between now and then °

## 2023-01-10 DIAGNOSIS — E782 Mixed hyperlipidemia: Secondary | ICD-10-CM | POA: Diagnosis not present

## 2023-01-10 DIAGNOSIS — Z114 Encounter for screening for human immunodeficiency virus [HIV]: Secondary | ICD-10-CM | POA: Diagnosis not present

## 2023-01-10 DIAGNOSIS — Z0000 Encounter for general adult medical examination without abnormal findings: Secondary | ICD-10-CM | POA: Diagnosis not present

## 2023-01-10 DIAGNOSIS — D649 Anemia, unspecified: Secondary | ICD-10-CM | POA: Diagnosis not present

## 2023-01-10 DIAGNOSIS — E559 Vitamin D deficiency, unspecified: Secondary | ICD-10-CM | POA: Diagnosis not present

## 2023-01-11 LAB — COMPREHENSIVE METABOLIC PANEL
ALT: 17 IU/L (ref 0–32)
AST: 20 IU/L (ref 0–40)
Albumin: 4.4 g/dL (ref 3.9–4.9)
Alkaline Phosphatase: 67 IU/L (ref 44–121)
BUN/Creatinine Ratio: 16 (ref 9–23)
BUN: 14 mg/dL (ref 6–24)
Bilirubin Total: 0.3 mg/dL (ref 0.0–1.2)
CO2: 24 mmol/L (ref 20–29)
Calcium: 9.3 mg/dL (ref 8.7–10.2)
Chloride: 105 mmol/L (ref 96–106)
Creatinine, Ser: 0.87 mg/dL (ref 0.57–1.00)
Globulin, Total: 2.4 g/dL (ref 1.5–4.5)
Glucose: 105 mg/dL — ABNORMAL HIGH (ref 70–99)
Potassium: 4.4 mmol/L (ref 3.5–5.2)
Sodium: 142 mmol/L (ref 134–144)
Total Protein: 6.8 g/dL (ref 6.0–8.5)
eGFR: 82 mL/min/{1.73_m2} (ref >59)

## 2023-01-11 LAB — CBC
Hematocrit: 41.4 % (ref 34.0–46.6)
Hemoglobin: 13.4 g/dL (ref 11.1–15.9)
MCH: 29.8 pg (ref 26.6–33.0)
MCHC: 32.4 g/dL (ref 31.5–35.7)
MCV: 92 fL (ref 79–97)
Platelets: 311 10*3/uL (ref 150–450)
RBC: 4.5 x10E6/uL (ref 3.77–5.28)
RDW: 11.9 % (ref 11.7–15.4)
WBC: 5.3 10*3/uL (ref 3.4–10.8)

## 2023-01-11 LAB — TSH: TSH: 1.25 u[IU]/mL (ref 0.450–4.500)

## 2023-01-11 LAB — HIV ANTIBODY (ROUTINE TESTING W REFLEX): HIV Screen 4th Generation wRfx: NONREACTIVE

## 2023-01-11 LAB — IRON,TIBC AND FERRITIN PANEL
Ferritin: 207 ng/mL — ABNORMAL HIGH (ref 15–150)
Iron Saturation: 28 % (ref 15–55)
Iron: 80 ug/dL (ref 27–159)
Total Iron Binding Capacity: 281 ug/dL (ref 250–450)
UIBC: 201 ug/dL (ref 131–425)

## 2023-01-11 LAB — LIPID PANEL
Chol/HDL Ratio: 5.8 ratio — ABNORMAL HIGH (ref 0.0–4.4)
Cholesterol, Total: 251 mg/dL — ABNORMAL HIGH (ref 100–199)
HDL: 43 mg/dL (ref >39)
LDL Chol Calc (NIH): 181 mg/dL — ABNORMAL HIGH (ref 0–99)
Triglycerides: 145 mg/dL (ref 0–149)
VLDL Cholesterol Cal: 27 mg/dL (ref 5–40)

## 2023-01-11 LAB — VITAMIN D 25 HYDROXY (VIT D DEFICIENCY, FRACTURES): Vit D, 25-Hydroxy: 41 ng/mL (ref 30.0–100.0)

## 2023-01-12 DIAGNOSIS — Z1211 Encounter for screening for malignant neoplasm of colon: Secondary | ICD-10-CM | POA: Diagnosis not present

## 2023-01-17 LAB — COLOGUARD: COLOGUARD: NEGATIVE

## 2023-01-18 NOTE — Progress Notes (Signed)
Annual Physical Exam Visit  Patient Information:  Patient ID: Ashley Murray, female DOB: September 21, 1975 Age: 47 y.o. MRN: [id number]   Subjective:   CC: Annual Physical Exam  HPI:  Ashley Murray is here for their annual physical.  I reviewed the past medical history, family history, social history, surgical history, and allergies today and changes were made as necessary.  Please see the problem list section below for additional details.  Past Medical History: Past Medical History:  Diagnosis Date   Anemia    Blood transfusion without reported diagnosis    Depression    Fibroid    HLD (hyperlipidemia)    Tension headache    Past Surgical History: Past Surgical History:  Procedure Laterality Date   LAPAROSCOPIC ASSISTED VAGINAL HYSTERECTOMY N/A 04/21/2019   Procedure: LAPAROSCOPIC ASSISTED VAGINAL HYSTERECTOMY;  Surgeon: Linzie Collin, MD;  Location: [facility] ORS;  Service: Gynecology;  Laterality: N/A;   OTHER SURGICAL HISTORY     Hospitalized [facility] 2009 for muscle problems, EMGs of both upper arms  neg 09/12/07-Dr Chestine Spore, neuro abd, US WNL 04/2009   TOTAL ABDOMINAL HYSTERECTOMY     WISDOM TOOTH EXTRACTION     Family History: Family History  Problem Relation Age of Onset   Breast cancer Mother 93       age 65 then age 76   Cancer Mother 46       BRCA gene neg   Hyperlipidemia Father    Kidney disease Father 44       kidney polyp s/p benign biopsy   Cancer Paternal Aunt 72       kidney   Coronary artery disease Paternal Grandfather        Bypass at older age   Cancer Cousin 66       kidney   Diabetes Neg Hx    Stroke Neg Hx    Hypertension Neg Hx    Allergies: No Known Allergies Health Maintenance: Health Maintenance  Topic Date Due   Colonoscopy  Never done   COVID-19 Vaccine (4 - 2023-24 season) 03/10/2022   INFLUENZA VACCINE  02/08/2023   DTaP/Tdap/Td (4 - Td or Tdap) 01/04/2033   Hepatitis C Screening  Completed   HIV Screening  Completed   HPV  VACCINES  Aged Out    HM Colonoscopy          Overdue - Colonoscopy (Every 10 Years) Never done    No completion history exists for this topic.           Medications: Current Outpatient Medications on File Prior to Visit  Medication Sig Dispense Refill   Multiple Vitamin (MULTIVITAMIN WITH MINERALS) TABS tablet Take 1 tablet by mouth daily.     Red Yeast Rice Extract (RED YEAST RICE PO) Take 2 capsules by mouth daily.     No current facility-administered medications on file prior to visit.    Review of Systems: No headache, visual changes, nausea, vomiting, diarrhea, constipation, dizziness, abdominal pain, skin rash, fevers, chills, night sweats, swollen lymph nodes, weight loss, chest pain, body aches, joint swelling, muscle aches, shortness of breath, mood changes, visual or auditory hallucinations reported.  Objective:   Vitals:   01/05/23 0826  BP: 128/78  Pulse: 67  SpO2: 98%   Vitals:   01/05/23 0826  Weight: 147 lb (66.7 kg)  Height: 5\' 2"  (1.575 m)   Body mass index is 26.89 kg/m.  General: Well Developed, well nourished, and in no acute distress.  Neuro: Alert  and oriented x3, extra-ocular muscles intact, sensation grossly intact. Cranial nerves II through XII are grossly intact, motor, sensory, and coordinative functions are intact. HEENT: Normocephalic, atraumatic, pupils equal round reactive to light, neck supple, no masses, no lymphadenopathy, thyroid nonpalpable. Oropharynx, nasopharynx, external ear canals are unremarkable. Skin: Warm and dry, no rashes noted.  Cardiac: Regular rate and rhythm, no murmurs rubs or gallops. No peripheral edema. Pulses symmetric. Respiratory: Clear to auscultation bilaterally. Not using accessory muscles, speaking in full sentences.  Abdominal: Soft, nontender, nondistended, positive bowel sounds, no masses, no organomegaly. Musculoskeletal: Shoulder, elbow, wrist, hip, knee, ankle stable, and with full range of  motion.  Female chaperone initials: KP present throughout the physical examination.  Impression and Recommendations:   The patient was counselled, risk factors were discussed, and anticipatory guidance given.  Problem List Items Addressed This Visit       Cardiovascular and Mediastinum   VARICOSE VEINS, LOWER EXTREMITIES     Other   Symptomatic anemia   Relevant Orders   CBC (Completed)   Iron, TIBC and Ferritin Panel (Completed)   Need for Tdap vaccination   Relevant Orders   Tdap vaccine greater than or equal to 7yo IM (Completed)   HYPERLIPIDEMIA   Relevant Orders   Comprehensive metabolic panel (Completed)   Lipid panel (Completed)   Healthcare maintenance - Primary    Annual examination completed, risk stratification labs ordered, anticipatory guidance provided.  We will follow labs once resulted.      Relevant Orders   CBC (Completed)   Comprehensive metabolic panel (Completed)   HIV Antibody (routine testing w rflx) (Completed)   Lipid panel (Completed)   TSH (Completed)   VITAMIN D 25 Hydroxy (Vit-D Deficiency, Fractures) (Completed)   Cologuard (Completed)   Iron, TIBC and Ferritin Panel (Completed)   Annual physical exam   Relevant Orders   CBC (Completed)   Comprehensive metabolic panel (Completed)   HIV Antibody (routine testing w rflx) (Completed)   Lipid panel (Completed)   TSH (Completed)   VITAMIN D 25 Hydroxy (Vit-D Deficiency, Fractures) (Completed)   Cologuard (Completed)   Iron, TIBC and Ferritin Panel (Completed)   Other Visit Diagnoses     Colon cancer screening       Relevant Orders   Cologuard (Completed)   Screening for HIV (human immunodeficiency virus)       Relevant Orders   HIV Antibody (routine testing w rflx) (Completed)   Vitamin D deficiency       Relevant Orders   VITAMIN D 25 Hydroxy (Vit-D Deficiency, Fractures) (Completed)        Orders & Medications Medications: No orders of the defined types were placed in this  encounter.  Orders Placed This Encounter  Procedures   Tdap vaccine greater than or equal to 7yo IM   CBC   Comprehensive metabolic panel   HIV Antibody (routine testing w rflx)   Lipid panel   TSH   VITAMIN D 25 Hydroxy (Vit-D Deficiency, Fractures)   Cologuard   Iron, TIBC and Ferritin Panel     Return in about 1 year (around 01/05/2024).    Jerrol Banana, MD, [facility]   Primary Care Sports Medicine Primary Care and Sports Medicine at [facility]

## 2023-01-18 NOTE — Progress Notes (Signed)
Labs added R73.09  KP 

## 2023-01-18 NOTE — Assessment & Plan Note (Signed)
Annual examination completed, risk stratification labs ordered, anticipatory guidance provided.  We will follow labs once resulted. 

## 2023-07-23 ENCOUNTER — Other Ambulatory Visit: Payer: Self-pay | Admitting: Family Medicine

## 2023-07-23 DIAGNOSIS — Z1231 Encounter for screening mammogram for malignant neoplasm of breast: Secondary | ICD-10-CM

## 2023-08-03 ENCOUNTER — Ambulatory Visit
Admission: RE | Admit: 2023-08-03 | Discharge: 2023-08-03 | Disposition: A | Discharge status: Home / Self Care | Payer: BC Managed Care – PPO | Source: Ambulatory Visit | Attending: Family Medicine | Admitting: Family Medicine

## 2023-08-03 DIAGNOSIS — Z1231 Encounter for screening mammogram for malignant neoplasm of breast: Secondary | ICD-10-CM | POA: Insufficient documentation

## 2023-11-04 ENCOUNTER — Telehealth

## 2023-11-04 DIAGNOSIS — R200 Anesthesia of skin: Secondary | ICD-10-CM

## 2023-11-05 NOTE — Progress Notes (Signed)
 Because of having intermittent tongue numbness, I feel your condition warrants further evaluation and I recommend that you be seen for a face to face visit.  Please contact your primary care physician practice to be seen. Many offices offer virtual options to be seen via video if you are not comfortable going in person to a medical facility at this time.  NOTE: You will NOT be charged for this eVisit.  If you do not have a PCP, [facility] offers a free physician referral service available at [phone]. Our trained staff has the experience, knowledge and resources to put you in touch with a physician who is right for you.    If you are having a true medical emergency please call 911.   Your e-visit answers were reviewed by a board certified advanced clinical practitioner to complete your personal care plan.  Thank you for using e-Visits.  I have spent 5 minutes in review of e-visit questionnaire, review and updating patient chart, medical decision making and response to patient.   Angelia Kelp, PA-C

## 2024-01-07 ENCOUNTER — Ambulatory Visit (INDEPENDENT_AMBULATORY_CARE_PROVIDER_SITE_OTHER): Payer: Self-pay | Admitting: Family Medicine

## 2024-01-07 ENCOUNTER — Encounter: Payer: Self-pay | Admitting: Family Medicine

## 2024-01-07 VITALS — BP 116/86 | HR 68 | Ht 62.0 in | Wt 149.2 lb

## 2024-01-07 DIAGNOSIS — Z23 Encounter for immunization: Secondary | ICD-10-CM | POA: Diagnosis not present

## 2024-01-07 DIAGNOSIS — R7309 Other abnormal glucose: Secondary | ICD-10-CM | POA: Diagnosis not present

## 2024-01-07 DIAGNOSIS — D649 Anemia, unspecified: Secondary | ICD-10-CM

## 2024-01-07 DIAGNOSIS — E782 Mixed hyperlipidemia: Secondary | ICD-10-CM | POA: Diagnosis not present

## 2024-01-07 DIAGNOSIS — Z0000 Encounter for general adult medical examination without abnormal findings: Secondary | ICD-10-CM | POA: Diagnosis not present

## 2024-01-07 DIAGNOSIS — I8390 Asymptomatic varicose veins of unspecified lower extremity: Secondary | ICD-10-CM

## 2024-01-07 DIAGNOSIS — R220 Localized swelling, mass and lump, head: Secondary | ICD-10-CM

## 2024-01-07 NOTE — Assessment & Plan Note (Signed)
 Hyperlipidemia Hyperlipidemia with concern for total cholesterol to HDL ratio. Cardiovascular risk at 1.9%, below statin therapy threshold. Discussed genetic factors and importance of monitoring cholesterol trends. - Order lipid panel to assess current cholesterol levels. - Continue red yeast rice supplement. - Emphasize lifestyle modifications to manage cholesterol levels. - Reassess cardiovascular risk based on new lab results.

## 2024-01-07 NOTE — Assessment & Plan Note (Signed)
 Annual examination completed, risk stratification labs ordered, anticipatory guidance provided.  We will follow labs once resulted.  Shingles vaccination Discussed shingles vaccination benefits, especially for those with chickenpox history. Explained varicella zoster virus mechanism and vaccination benefits. - Administer first dose of shingles vaccine today. - Schedule nurse visit for the second dose of shingles vaccine 2-6 months.

## 2024-01-07 NOTE — Assessment & Plan Note (Signed)
 Lower extremity venous insufficiency - Varicose veins primarily on the right side - More noticeable in the winter when skin is paler - Currently not causing significant symptoms or issues

## 2024-01-07 NOTE — Patient Instructions (Addendum)
-   Obtain fasting labs with orders provided (can have water or black coffee but otherwise no food or drink x 8 hours before labs) - Review information provided - Attend eye doctor annually, dentist every 6 months, work towards or maintain 30 minutes of moderate intensity physical activity at least 5 days per week, and consume a balanced diet - Return in 1 year for physical - Contact us  for any questions between now and then    Here is the refined patient action plan based on the provided details:  Patient Action Plan  Healthcare Maintenance: - Administer the first dose of the shingles vaccine today. - Schedule a nurse visit for the second dose of the shingles vaccine in 2-6 months.  Hyperlipidemia: - Order a lipid panel to assess current cholesterol levels. - Continue taking red yeast rice supplements. - Focus on lifestyle modifications to manage cholesterol levels. - Reassess cardiovascular risk after receiving new lab results.  Intermittent Tongue Swelling: - Monitor for potential triggers, including food and environmental factors. - Seek immediate medical attention if swelling leads to difficulty swallowing. - Contact the office to discuss prescribing an EpiPen if severe allergic reactions occur.  Varicose Veins, Lower Extremities: - No significant symptoms or issues currently; monitor for any changes.  Red Flags: - If you experience difficulty swallowing or breathing, seek immediate medical attention.

## 2024-01-07 NOTE — Assessment & Plan Note (Signed)
 Oropharyngeal swelling - Occasional tongue swelling, typically at the end of the day - Swelling is inconsistent and does not occur daily - Associated with dehydration - No difficulty swallowing or breathing  Intermittent tongue swelling Intermittent tongue swelling possibly linked to dehydration or mild allergies. No consistent pattern identified. - Monitor for potential triggers, including food and environmental factors. - Advise to seek immediate medical attention if swelling leads to difficulty swallowing. - Consider prescribing an EpiPen if severe allergic reactions occur. Contact office for this.

## 2024-01-07 NOTE — Progress Notes (Signed)
 Annual Physical Exam Visit  Patient Information:  Patient ID: Ashley Murray, female DOB: 1976-06-22 Age: 48 y.o. MRN: [id number]   Subjective:   CC: Annual Physical Exam  HPI:  Ashley Murray is here for their annual physical.  I reviewed the past medical history, family history, social history, surgical history, and allergies today and changes were made as necessary.  Please see the problem list section below for additional details.  Past Medical History: Past Medical History:  Diagnosis Date   Anemia    Blood transfusion without reported diagnosis    Depression    Fibroid    HLD (hyperlipidemia)    Tension headache    Past Surgical History: Past Surgical History:  Procedure Laterality Date   LAPAROSCOPIC ASSISTED VAGINAL HYSTERECTOMY N/A 04/21/2019   Procedure: LAPAROSCOPIC ASSISTED VAGINAL HYSTERECTOMY;  Surgeon: Janit Alm Agent, MD;  Location: [facility];  Service: Gynecology;  Laterality: N/A;   OTHER SURGICAL HISTORY     Hospitalized [facility] 2009 for muscle problems, EMGs of both upper arms  neg 09/12/07-Dr Gretta, neuro abd, [place]  WNL 04/2009   TOTAL ABDOMINAL HYSTERECTOMY     WISDOM TOOTH EXTRACTION     Family History: Family History  Problem Relation Age of Onset   Breast cancer Mother 10       age 53 then age 75   Cancer Mother 71       BRCA gene neg   Hyperlipidemia Father    Kidney disease Father 61       kidney polyp s/p benign biopsy   Cancer Paternal Aunt 84       kidney   Coronary artery disease Paternal Grandfather        Bypass at older age   Cancer Cousin 54       kidney   Diabetes Neg Hx    Stroke Neg Hx    Hypertension Neg Hx    Allergies: No Known Allergies Health Maintenance: Health Maintenance  Topic Date Due   Hepatitis B Vaccines (1 of 3 - 19+ 3-dose series) Never done   COVID-19 Vaccine (4 - 2024-25 season) 01/23/2024 (Originally 03/11/2023)   INFLUENZA VACCINE  02/08/2024   Zoster Vaccines- Shingrix (2 of 2) 03/03/2024   MAMMOGRAM   08/02/2025   DTaP/Tdap/Td (4 - Td or Tdap) 01/04/2033   Colonoscopy  01/11/2033   Hepatitis C Screening  Completed   HIV Screening  Completed   HPV VACCINES  Aged Out   Meningococcal B Vaccine  Aged Out    HM Colonoscopy          Upcoming     Colonoscopy (Every 10 Years) Next due on 01/11/2033    01/12/2023  Done   Only the first 1 history entries have been loaded, but more history exists.               Medications: Current Outpatient Medications on File Prior to Visit  Medication Sig Dispense Refill   Multiple Vitamin (MULTIVITAMIN WITH MINERALS) TABS tablet Take 1 tablet by mouth daily.     Red Yeast Rice Extract (RED YEAST RICE PO) Take 2 capsules by mouth daily.     No current facility-administered medications on file prior to visit.    Objective:   Vitals:   01/07/24 0819  BP: 116/86  Pulse: 68  SpO2: 98%   Vitals:   01/07/24 0819  Weight: 149 lb 3.2 oz (67.7 kg)  Height: 5' 2 (1.575 m)   Body mass index is 27.29  kg/m.  General: Well Developed, well nourished, and in no acute distress.  Neuro: Alert and oriented x3, extra-ocular muscles intact, sensation grossly intact. Cranial nerves II through XII are grossly intact, motor, sensory, and coordinative functions are intact. HEENT: Normocephalic, atraumatic, neck supple, no masses, no lymphadenopathy, thyroid  nonenlarged. Oropharynx, nasopharynx, external ear canals are unremarkable. Skin: Warm and dry, no rashes noted.  Cardiac: Regular rate and rhythm, no murmurs rubs or gallops. No peripheral edema. Pulses symmetric. Respiratory: Clear to auscultation bilaterally. Speaking in full sentences.  Abdominal: Soft, nontender, nondistended, positive bowel sounds, no masses, no organomegaly. Musculoskeletal: Stable, and with full range of motion.  Impression and Recommendations:   The patient was counselled, risk factors were discussed, and anticipatory guidance given.  Problem List Items Addressed This  Visit     Healthcare maintenance - Primary       Annual examination completed, risk stratification labs ordered, anticipatory guidance provided.  We will follow labs once resulted.  Shingles vaccination Discussed shingles vaccination benefits, especially for those with chickenpox history. Explained varicella zoster virus mechanism and vaccination benefits. - Administer first dose of shingles vaccine today. - Schedule nurse visit for the second dose of shingles vaccine 2-6 months.     HYPERLIPIDEMIA   Hyperlipidemia Hyperlipidemia with concern for total cholesterol to HDL ratio. Cardiovascular risk at 1.9%, below statin therapy threshold. Discussed genetic factors and importance of monitoring cholesterol trends. - Order lipid panel to assess current cholesterol levels. - Continue red yeast rice supplement. - Emphasize lifestyle modifications to manage cholesterol levels. - Reassess cardiovascular risk based on new lab results.      Relevant Orders   Comprehensive metabolic panel with GFR   Lipid panel   Mild tongue swelling   Oropharyngeal swelling - Occasional tongue swelling, typically at the end of the day - Swelling is inconsistent and does not occur daily - Associated with dehydration - No difficulty swallowing or breathing  Intermittent tongue swelling Intermittent tongue swelling possibly linked to dehydration or mild allergies. No consistent pattern identified. - Monitor for potential triggers, including food and environmental factors. - Advise to seek immediate medical attention if swelling leads to difficulty swallowing. - Consider prescribing an EpiPen if severe allergic reactions occur. Contact office for this.      VARICOSE VEINS, LOWER EXTREMITIES   Lower extremity venous insufficiency - Varicose veins primarily on the right side - More noticeable in the winter when skin is paler - Currently not causing significant symptoms or issues      Other Visit  Diagnoses       Symptomatic anemia       Relevant Orders   CBC   Iron, TIBC and Ferritin Panel     Abnormal glucose       Relevant Orders   Hemoglobin A1c     Need for shingles vaccine            Orders & Medications Medications: No orders of the defined types were placed in this encounter.  Orders Placed This Encounter  Procedures   Varicella-zoster vaccine IM   Comprehensive metabolic panel with GFR   CBC   Hemoglobin A1c   Lipid panel   Iron, TIBC and Ferritin Panel     Return in about 1 year (around 01/06/2025) for CPE.    Selinda JINNY Ku, MD, [facility]   Primary Care Sports Medicine Primary [facility] and [facility] at [facility]

## 2024-04-23 ENCOUNTER — Ambulatory Visit

## 2025-01-07 ENCOUNTER — Encounter: Admitting: Family Medicine
